# Patient Record
Sex: Male | Born: 1950 | Race: White | Hispanic: No | Marital: Married | Smoking: Former smoker
Health system: Southern US, Community
[De-identification: ages and names within clinical notes are randomized; demographics above are authoritative.]

## PROBLEM LIST (undated history)

## (undated) DIAGNOSIS — J069 Acute upper respiratory infection, unspecified: Secondary | ICD-10-CM

## (undated) DIAGNOSIS — J45909 Unspecified asthma, uncomplicated: Secondary | ICD-10-CM

## (undated) DIAGNOSIS — I10 Essential (primary) hypertension: Secondary | ICD-10-CM

## (undated) DIAGNOSIS — K219 Gastro-esophageal reflux disease without esophagitis: Secondary | ICD-10-CM

## (undated) HISTORY — DX: Unspecified asthma, uncomplicated: J45.909

## (undated) HISTORY — DX: Acute upper respiratory infection, unspecified: J06.9

---

## 2013-12-28 ENCOUNTER — Ambulatory Visit
Admission: RE | Admit: 2013-12-28 | Discharge: 2013-12-28 | Disposition: A | Discharge status: Home / Self Care | Payer: BC Managed Care – PPO | Source: Ambulatory Visit | Attending: Family Medicine | Admitting: Family Medicine

## 2013-12-28 ENCOUNTER — Other Ambulatory Visit: Payer: Self-pay | Admitting: Family Medicine

## 2013-12-28 DIAGNOSIS — R05 Cough: Secondary | ICD-10-CM

## 2013-12-28 DIAGNOSIS — R053 Chronic cough: Secondary | ICD-10-CM

## 2013-12-28 DIAGNOSIS — R0789 Other chest pain: Secondary | ICD-10-CM

## 2015-03-07 IMAGING — CR DG CHEST 2V
2 series · 2 of 2 positions shown · non-contrast
Comparison: None.

CLINICAL DATA: Cough

EXAM:
CHEST  2 VIEW

[view not recorded (1 of 2)]
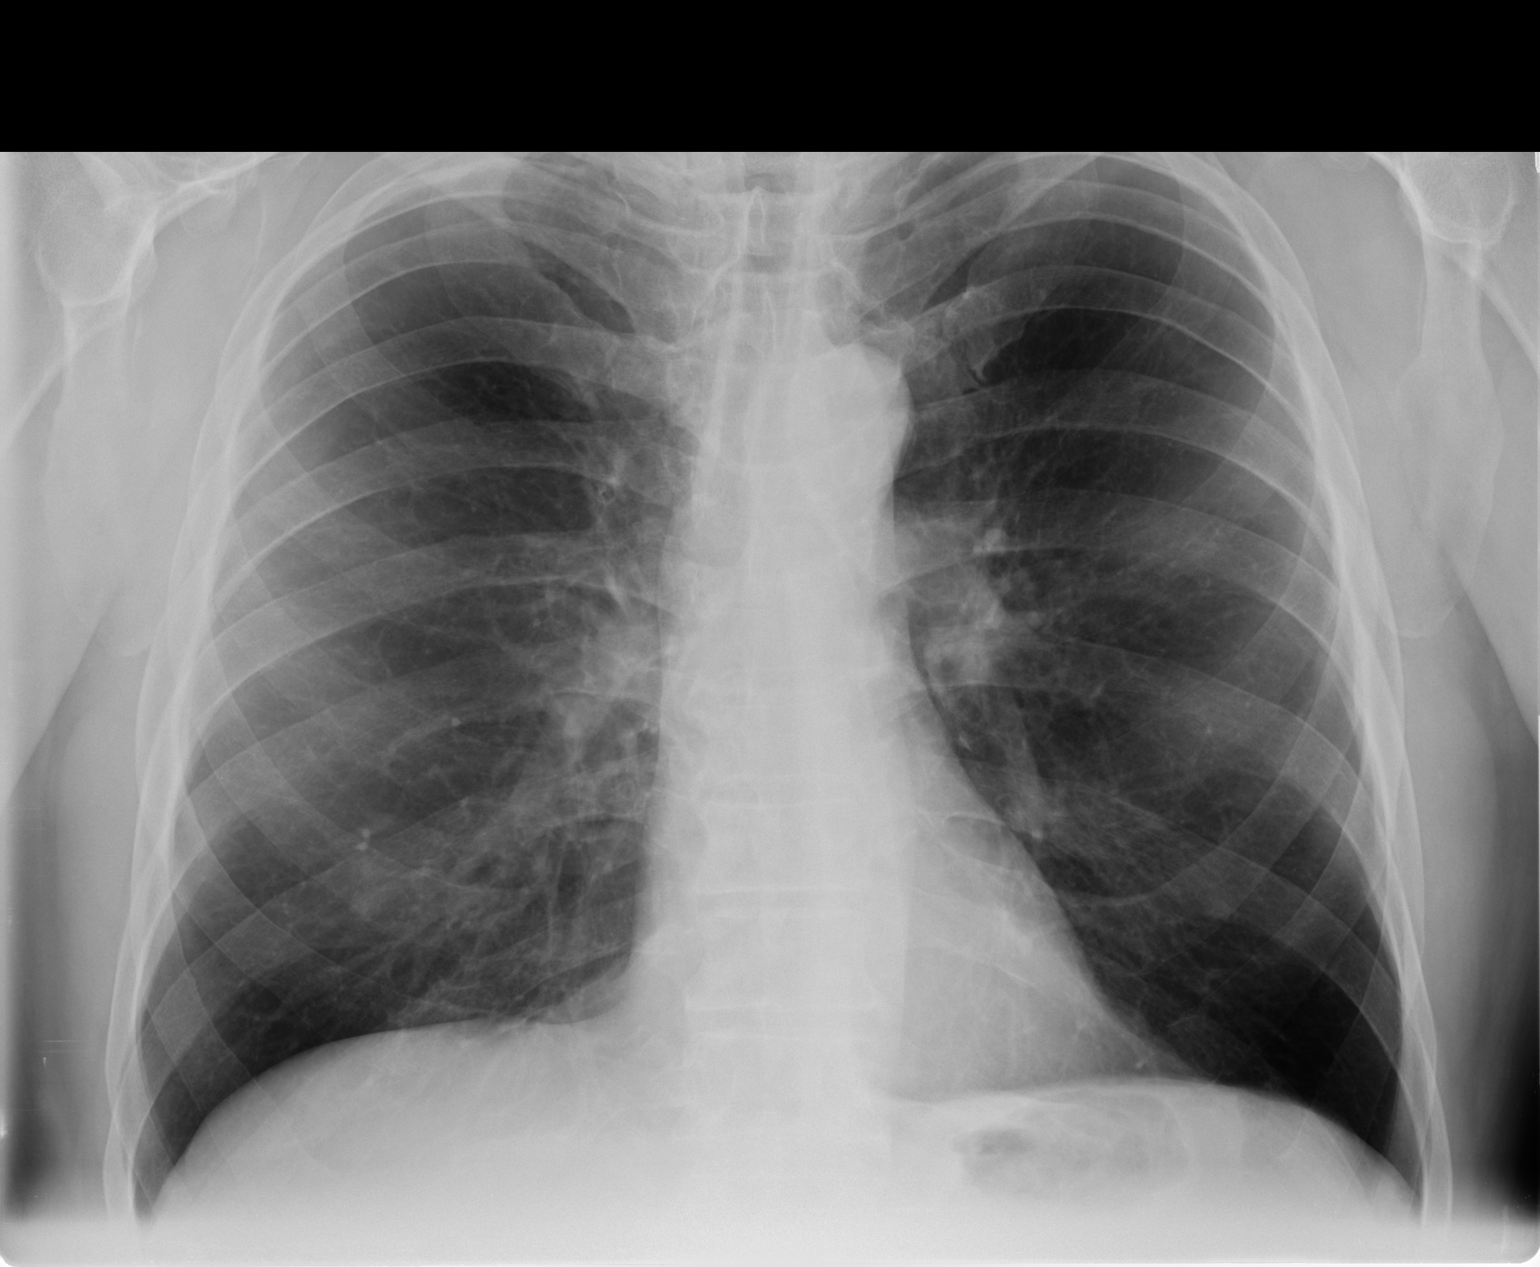

[view not recorded (2 of 2)]
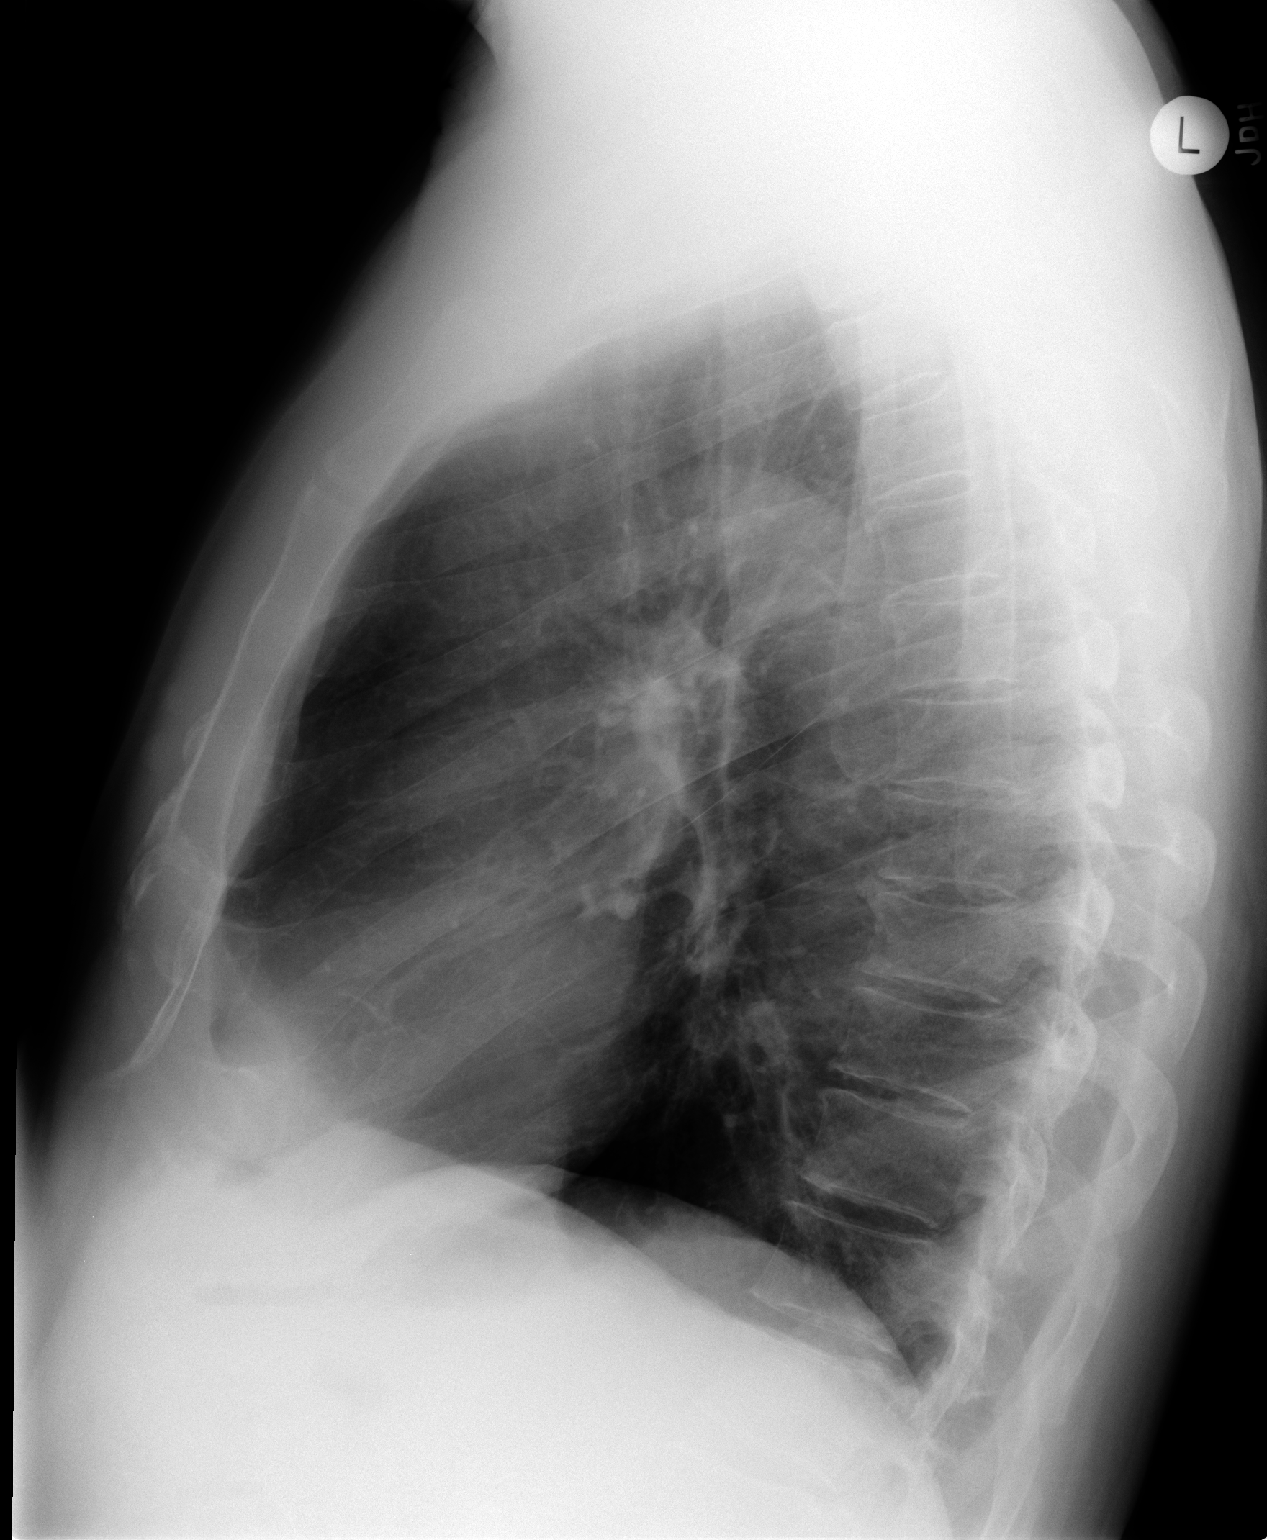

[2 of 2 positions shown; findings below may reference images not displayed]

FINDINGS: Normal cardiac and mediastinal contours. No consolidative pulmonary
opacities. Suggestion of a 1.5 cm nodular density projecting within
the infrahilar location on the lateral view. No pleural effusion or
pneumothorax. Mid thoracic spine degenerative change.
IMPRESSION: Suggestion of a 1.5 cm nodular density projecting within the
infrahilar location on the lateral view. While this may potentially
be secondary to overlapping structures, a true pulmonary nodule in
this location is not excluded. Recommend correlation with priors if
available. If no priors are available for comparison, this can be
correlated with chest CT.

No acute cardiopulmonary process

These results will be called to the ordering clinician or
representative by the Radiologist Assistant, and communication
documented in the PACS or zVision Dashboard.

## 2023-03-09 NOTE — Progress Notes (Unsigned)
New Patient Note  RE: Roberto [name] MRN: [id number] DOB: [birth year] Date of Office Visit: 03/10/2023  Consult requested by: Alver Fisher, MD Primary care provider: Patient, No Pcp Per  Chief Complaint: Establish Care, Allergy Testing (Seasonal or food allergies), and asthma  History of Present Illness: I had the pleasure of seeing Wain Murano for initial evaluation at the [facility] on 03/10/2023. He is a 72 y.o. male, who is referred here by Dr. Carlisle Cater (pulmonology) for the evaluation of allergic rhinitis and asthma.   Discussed the use of AI scribe software for clinical note transcription with the patient, who gave verbal consent to proceed.  The patient, a veteran with a history of asthma, presented with a long-standing history of breathing difficulties. He reported waking up in the middle of the night, requiring the use of an emergency inhaler due to shortness of breath. This has been a recurring issue for many years, with the patient describing symptoms of wheezing and congestion, irrespective of the time of year. The frequency of inhaler use varied, with the patient reporting going through an inhaler in two to three weeks during episodes of severe breathing difficulties.  The patient also reported a history of allergies, particularly during the spring season, which he defined as starting in April and ending in June. During this period, he experienced significant allergy symptoms, requiring daily use of Flonase. Outside of this season, the use of Flonase was infrequent.   The patient had a recent consultation with a pulmonology specialist due to two urgent care visits for breathing difficulties. The specialist prescribed Asmanex and Montelukast, which the patient reported as significantly improving his breathing issues.  In addition to his respiratory issues, the patient reported a history of cholesterol and lactose intolerance. He also reported a  reaction to a testosterone booster supplement, which caused wheeziness. The patient denied any other food allergies or restrictions.  The patient had a CT scan scheduled due to an unidentified finding in the upper right lung from a previous CT scan. He also reported having had three courses of steroids in the past year due to his breathing problems. The patient denied any history of pneumonia or hospitalization due to his breathing issues.      Previous work up includes: 2024 bloodwork was positive to grass, trees, ragweed. No prior AIT. Last eye exam: within the past year. History of reflux: denies.  Smoking exposure: quit in 1997. Up to date with flu vaccine: yes. Up to date with pneumonia vaccine: yes. Up to date with COVID-19 vaccine: yes. Prior Covid-19 infection: yes.  Testosterone supplement    Assessment and Plan: Riky is a 72 y.o. male with: Other allergic rhinitis Seasonal allergic rhinitis due to pollen Allergic conjunctivitis of both eyes Symptoms of nasal congestion and itchy eyes during spring months (April-June). Managed with Flonase and occasional eye drops. No history of allergy shots or ENT consultation. 2024 bloodwork was positive to grass, trees, ragweed. Start environmental control measures as below. Continue Singulair (montelukast) 10mg  daily at night. Use Flonase (fluticasone) nasal spray 1-2 sprays per nostril once a day as needed for nasal congestion.  Nasal saline spray (i.e., Simply Saline) or nasal saline lavage (i.e., NeilMed) is recommended as needed and prior to medicated nasal sprays. Consider allergy injections for long term control if above medications do not help the symptoms.  Not well controlled moderate persistent asthma Long-standing history of wheezing and trouble breathing, with increased albuterol inhaler use in  the past year. Recent episodes of nocturnal dyspnea requiring emergency inhaler use. Currently managed with Asmanex 1 puff  twice daily and Montelukast daily, with Albuterol as needed. Recent pulmonary consultation with breathing tests and upcoming CT scan. Had elevated FenO. Requesting notes from the [place]. Today's spirometry showed no overt abnormalities noted given today's efforts with  no improvement in FEV1 post bronchodilator treatment. Clinically feeling unchanged.  Keep follow up with pulmonology and get CT chest as scheduled.  Daily controller medication(s): Asmanex 1 puff twice a day with spacer and rinse mouth afterwards. Once asthma is better controlled - recommend to use Combivent Respimat 2 puffs only as needed.  Breathing may be better controlled with ICS/LABA inhalers such as below - depending on what the [place] covers.  May use albuterol rescue inhaler 2 puffs every 4 to 6 hours as needed for shortness of breath, chest tightness, coughing, and wheezing. May use albuterol rescue inhaler 2 puffs 5 to 15 minutes prior to strenuous physical activities. Monitor frequency of use - if you need to use it more than twice per week on a consistent basis let us know.   Adverse effect of drug, subsequent encounter Reported adverse reactions to testosterone supplement (wheezing). Tolerates other capsule medications with no issues. Discussed that there is no skin testing for the specific ingredients in the supplement. Continue to avoid for now.   Return in about 6 months (around 09/08/2023).  No orders of the defined types were placed in this encounter.  Lab Orders  No laboratory test(s) ordered today    Other allergy screening: Food allergy: no Medication allergy:  Statins - leg pain Hymenoptera allergy: no Urticaria: no Eczema:no History of recurrent infections suggestive of immunodeficency: no  Diagnostics: Spirometry:  Tracings reviewed. His effort: It was hard to get consistent efforts and there is a question as to whether this reflects a maximal maneuver. FVC: 3.39L FEV1: 2.07L, 63%  predicted FEV1/FVC ratio: 61% Interpretation: No overt abnormalities noted given today's efforts with  no improvement in FEV1 post bronchodilator treatment. Clinically feeling unchanged.    Please see scanned spirometry results for details.  Past Medical History: There are no problems to display for this patient.  Past Medical History:  Diagnosis Date   Asthma    Recurrent upper respiratory infection (URI)    Past Surgical History: History reviewed. No pertinent surgical history. Medication List:  Current Outpatient Medications  Medication Sig Dispense Refill   ALBUTEROL SULFATE HFA IN      Mometasone Furoate 200 MCG/ACT AERO Take by mouth.     montelukast (SINGULAIR) 10 MG tablet Take by mouth.     Olodaterol HCl 2.5 MCG/ACT AERS Take by mouth.     rosuvastatin (CRESTOR) 40 MG tablet Take 40 mg by mouth daily.     sildenafil (VIAGRA) 100 MG tablet Take by mouth.     mometasone (ASMANEX) 220 MCG/ACT inhaler 1 puff in the evening Inhalation Once a day     No current facility-administered medications for this visit.   Allergies: No Known Allergies Social History: Social History   Socioeconomic History   Marital status: Married    Spouse name: Not on file   Number of children: Not on file   Years of education: Not on file   Highest education level: Not on file  Occupational History   Not on file  Tobacco Use   Smoking status: Former    Current packs/day: 0.00    Types: Cigarettes    Quit  date: 56    Years since quitting: 27.8    Passive exposure: Past   Smokeless tobacco: Never  Substance and Sexual Activity   Alcohol use: Not on file   Drug use: Not on file   Sexual activity: Not on file  Other Topics Concern   Not on file  Social History Narrative   Not on file   Social Determinants of Health   Financial Resource Strain: Not on file  Food Insecurity: Not on file  Transportation Needs: Not on file  Physical Activity: Not on file  Stress: Not on file   Social Connections: Not on file   Lives in a house. Smoking: quit in 1997 Occupation: retired Engineer, materials History: Water Damage/mildew in the house: no Engineer, civil (consulting) in the family room: no Carpet in the bedroom: yes Heating: heat pump Cooling: central Pet: no  Family History: History reviewed. No pertinent family history. Problem                               Relation Asthma                                   no Eczema                                no Food allergy                          no Allergic rhino conjunctivitis     no  Review of Systems  Constitutional:  Negative for appetite change, chills, fever and unexpected weight change.  HENT:  Negative for congestion and rhinorrhea.   Eyes:  Positive for itching.  Respiratory:  Negative for cough, chest tightness, shortness of breath and wheezing.   Cardiovascular:  Negative for chest pain.  Gastrointestinal:  Negative for abdominal pain.  Genitourinary:  Negative for difficulty urinating.  Skin:  Negative for rash.  Allergic/Immunologic: Positive for environmental allergies.  Neurological:  Negative for headaches.    Objective: BP (!) 150/86   Pulse 84   Temp 98.4 F (36.9 C)   Resp 12   Ht 6' (1.829 m)   Wt 208 lb 4.8 oz (94.5 kg)   SpO2 96%   BMI 28.25 kg/m  Body mass index is 28.25 kg/m. Physical Exam Vitals and nursing note reviewed.  Constitutional:      Appearance: Normal appearance. He is well-developed.  HENT:     Head: Normocephalic and atraumatic.     Right Ear: Tympanic membrane and external ear normal.     Left Ear: Tympanic membrane and external ear normal.     Nose: Nose normal.     Mouth/Throat:     Mouth: Mucous membranes are moist.     Pharynx: Oropharynx is clear.  Eyes:     Conjunctiva/sclera: Conjunctivae normal.  Cardiovascular:     Rate and Rhythm: Normal rate and regular rhythm.     Heart sounds: Normal heart sounds. No murmur heard.    No friction rub. No  gallop.  Pulmonary:     Effort: Pulmonary effort is normal.     Breath sounds: Normal breath sounds. No wheezing, rhonchi or rales.  Musculoskeletal:     Cervical back: Neck supple.  Skin:  General: Skin is warm.     Findings: No rash.  Neurological:     Mental Status: He is alert and oriented to person, place, and time.  Psychiatric:        Behavior: Behavior normal.   The plan was reviewed with the patient/family, and all questions/concerned were addressed.  It was my pleasure to see Roberto [name] today and participate in his care. Please feel free to contact me with any questions or concerns.  Sincerely,  Wyline Mood, DO Allergy & Immunology  [facility] office: [phone] [facility] office: [phone]

## 2023-03-10 ENCOUNTER — Other Ambulatory Visit: Payer: Self-pay

## 2023-03-10 ENCOUNTER — Ambulatory Visit (INDEPENDENT_AMBULATORY_CARE_PROVIDER_SITE_OTHER): Payer: No Typology Code available for payment source | Admitting: Allergy

## 2023-03-10 ENCOUNTER — Encounter: Payer: Self-pay | Admitting: Allergy

## 2023-03-10 VITALS — BP 150/86 | HR 84 | Temp 98.4°F | Resp 12 | Ht 72.0 in | Wt 208.3 lb

## 2023-03-10 DIAGNOSIS — J3089 Other allergic rhinitis: Secondary | ICD-10-CM

## 2023-03-10 DIAGNOSIS — Z888 Allergy status to other drugs, medicaments and biological substances status: Secondary | ICD-10-CM

## 2023-03-10 DIAGNOSIS — J301 Allergic rhinitis due to pollen: Secondary | ICD-10-CM

## 2023-03-10 DIAGNOSIS — T50905D Adverse effect of unspecified drugs, medicaments and biological substances, subsequent encounter: Secondary | ICD-10-CM

## 2023-03-10 DIAGNOSIS — J4540 Moderate persistent asthma, uncomplicated: Secondary | ICD-10-CM | POA: Diagnosis not present

## 2023-03-10 DIAGNOSIS — H1013 Acute atopic conjunctivitis, bilateral: Secondary | ICD-10-CM | POA: Diagnosis not present

## 2023-03-10 NOTE — Patient Instructions (Addendum)
Requesting notes from the [place].  Environmental allergies Start environmental control measures as below. Continue Singulair (montelukast) 10mg  daily at night. Use Flonase (fluticasone) nasal spray 1-2 sprays per nostril once a day as needed for nasal congestion.  Nasal saline spray (i.e., Simply Saline) or nasal saline lavage (i.e., NeilMed) is recommended as needed and prior to medicated nasal sprays.  Breathing Keep follow up with pulmonology. Daily controller medication(s): Asmanex 1 puff twice a day with spacer and rinse mouth afterwards. Once asthma is better controlled - recommend to use Combivent Respimat 2 puffs only as needed.  Breathing may be better controlled with ICS/LABA inhalers such as below - depending on what the [place] covers.  Dulera Advair HFA Advair Diskus Wixela Breo Symbicort  May use albuterol rescue inhaler 2 puffs every 4 to 6 hours as needed for shortness of breath, chest tightness, coughing, and wheezing. May use albuterol rescue inhaler 2 puffs 5 to 15 minutes prior to strenuous physical activities. Monitor frequency of use - if you need to use it more than twice per week on a consistent basis let us know.  Breathing control goals:  Full participation in all desired activities (may need albuterol before activity) Albuterol use two times or less a week on average (not counting use with activity) Cough interfering with sleep two times or less a month Oral steroids no more than once a year No hospitalizations   Return in about 6 months (around 09/08/2023). Or sooner if needed.  Make sure you get the CT chest done. Make sure you follow up with your PCP and pulmonologist.   Reducing Pollen Exposure Pollen seasons: trees (spring), grass (summer) and ragweed/weeds (fall). Keep windows closed in your home and car to lower pollen exposure.  Install air conditioning in the bedroom and throughout the house if possible.  Avoid going out in dry windy days -  especially early morning. Pollen counts are highest between 5 - 10 AM and on dry, hot and windy days.  Save outside activities for late afternoon or after a heavy rain, when pollen levels are lower.  Avoid mowing of grass if you have grass pollen allergy. Be aware that pollen can also be transported indoors on people and pets.  Dry your clothes in an automatic dryer rather than hanging them outside where they might collect pollen.  Rinse hair and eyes before bedtime.

## 2023-09-08 ENCOUNTER — Encounter: Payer: Self-pay | Admitting: Internal Medicine

## 2023-09-08 ENCOUNTER — Other Ambulatory Visit: Payer: Self-pay

## 2023-09-08 ENCOUNTER — Ambulatory Visit: Payer: No Typology Code available for payment source | Admitting: Allergy

## 2023-09-08 ENCOUNTER — Ambulatory Visit (INDEPENDENT_AMBULATORY_CARE_PROVIDER_SITE_OTHER): Admitting: Internal Medicine

## 2023-09-08 VITALS — BP 134/86 | HR 85 | Temp 98.1°F | Resp 18 | Ht 70.87 in | Wt 210.4 lb

## 2023-09-08 DIAGNOSIS — H1013 Acute atopic conjunctivitis, bilateral: Secondary | ICD-10-CM | POA: Insufficient documentation

## 2023-09-08 DIAGNOSIS — J3089 Other allergic rhinitis: Secondary | ICD-10-CM

## 2023-09-08 DIAGNOSIS — J4540 Moderate persistent asthma, uncomplicated: Secondary | ICD-10-CM | POA: Diagnosis not present

## 2023-09-08 DIAGNOSIS — J302 Other seasonal allergic rhinitis: Secondary | ICD-10-CM | POA: Insufficient documentation

## 2023-09-08 HISTORY — DX: Other allergic rhinitis: J30.89

## 2023-09-08 NOTE — Patient Instructions (Signed)
 Environmental allergies 2024 bloodwork was positive to grass, trees, ragweed. Start environmental control measures as below. Continue Singulair (montelukast) 10mg  daily at night. Use Flonase (fluticasone) nasal spray 1-2 sprays per nostril once a day as needed for nasal congestion.  Nasal saline spray (i.e., Simply Saline) or nasal saline lavage (i.e., NeilMed) is recommended as needed and prior to medicated nasal sprays. -Consider allergy injections to reduce lifetime symptoms and need for medications by teaching your immune system to become tolerant of the environmental allergens you are allergic to-return for allergy skin testing. Will wait till after July when your allergies are better controlled per your request.   Breathing Keep follow up with pulmonology. Daily controller medication(s): Asmanex 220mcg 1 puff twice a day with spacer and rinse mouth afterwards. Use Combivent Respimat 2 puffs only as needed.  May use albuterol rescue inhaler 2 puffs every 4 to 6 hours as needed for shortness of breath, chest tightness, coughing, and wheezing. May use albuterol rescue inhaler 2 puffs 5 to 15 minutes prior to strenuous physical activities. Monitor frequency of use - if you need to use it more than twice per week on a consistent basis let us  know.  Breathing control goals:  Full participation in all desired activities (may need albuterol before activity) Albuterol use two times or less a week on average (not counting use with activity) Cough interfering with sleep two times or less a month Oral steroids no more than once a year No hospitalizations   Follow up : after July for allergy testing 1-55 in preparation for allergy injections  It was a pleasure meeting you in clinic today! Thank you for allowing me to participate in your care.  Jonathon Neighbors, MD [facility]   Reducing Pollen Exposure Pollen seasons: trees (spring), grass (summer) and ragweed/weeds  (fall). Keep windows closed in your home and car to lower pollen exposure.  Install air conditioning in the bedroom and throughout the house if possible.  Avoid going out in dry windy days - especially early morning. Pollen counts are highest between 5 - 10 AM and on dry, hot and windy days.  Save outside activities for late afternoon or after a heavy rain, when pollen levels are lower.  Avoid mowing of grass if you have grass pollen allergy. Be aware that pollen can also be transported indoors on people and pets.  Dry your clothes in an automatic dryer rather than hanging them outside where they might collect pollen.  Rinse hair and eyes before bedtime.

## 2023-09-08 NOTE — Progress Notes (Signed)
 FOLLOW UP Date of Service/Encounter:  09/08/23  Subjective:  Roberto [name] (DOB: 1950-11-19) is a 73 y.o. male who [facility] in re-evaluation of the following: allergic rhinitis, asthma History obtained from: chart review and patient.  For Review, LV was on 03/10/23  with Dr. Burdette Carolin seen for intial visit for allergic rhinitis and asthma . See below for summary of history and diagnostics.  ----------------------------------------------------- Pertinent History/Diagnostics:  Other allergic rhinitis Seasonal allergic rhinitis due to pollen Allergic conjunctivitis of both eyes Symptoms of nasal congestion and itchy eyes during spring months (April-June). Managed with Flonase and occasional eye drops. No history of allergy shots or ENT consultation. 2024 bloodwork was positive to grass, trees, ragweed. Current plan: singulair 10 mg nightly, flonase 1-2 sprays daily, saline spray, consider AIT. Not well controlled moderate persistent asthma Long-standing history of wheezing and trouble breathing, with increased albuterol inhaler use in the past year. Recent episodes of nocturnal dyspnea requiring emergency inhaler use. Currently managed with Asmanex 220mcg 1 puff twice daily and Montelukast daily, with Albuterol as needed. Recent pulmonary consultation with breathing tests and upcoming CT scan. Had elevated FenO. Managed by Pulm at [facility] Current meds: Asmanex 220 mcg 2 puff BID, combivent respimat Adverse effect of drug, subsequent encounter Reported adverse reactions to testosterone supplement (wheezing). Tolerates other capsule medications with no issues. --------------------------------------------------- Today presents for follow-up. Discussed the use of AI scribe software for clinical note transcription with the patient, who gave verbal consent to proceed.  History of Present Illness   Roberto [name] is a 73 year old male with allergic  rhinitis and asthma who presents for management of his allergies.  He experiences significant allergy symptoms during the spring, particularly in May and June, due to tree pollen. He has a history of severe reactions to poison ivy during childhood, which required medical attention. He is considering further allergy testing and potential allergy injections.  His asthma is managed with Singulair at night, Asmanex inhaler once in the morning and once at night, and Combivent inhaler twice daily. His breathing is well-controlled with this regimen, and he finds regular exercise beneficial for his overall health. He is followed by pulmonary at the [place].  For allergic rhinitis, he uses Flonase nasal spray each morning. He avoids oral antihistamines as they cause undesirable side effects. Blood work done in 2024 indicated allergies to specific trees and grasses.  He is retired, allowing flexibility in scheduling future appointments. He is not currently taking any antihistamines other than Singulair and Flonase, which he plans to continue. No uncontrolled breathing issues with current medication regimen.       All medications reviewed by clinical staff and updated in chart. No new pertinent medical or surgical history except as noted in HPI.  ROS: All others negative except as noted per HPI.   Objective:  BP 134/86 (BP Location: Right Arm, Patient Position: Sitting, Cuff Size: Normal)   Pulse 85   Temp 98.1 F (36.7 C) (Temporal)   Resp 18   Ht 5' 10.87" (1.8 m)   Wt 210 lb 6.4 oz (95.4 kg)   SpO2 95%   BMI 29.46 kg/m  Body mass index is 29.46 kg/m. Physical Exam: General Appearance:  Alert, cooperative, no distress, appears stated age  Head:  Normocephalic, without obvious abnormality, atraumatic  Eyes:  Conjunctiva clear, EOM's intact  Ears EACs normal bilaterally and normal TMs bilaterally  Nose: Nares normal, normal mucosa and no visible anterior  polyps  Throat: Lips, tongue normal; teeth  and gums normal, normal posterior oropharynx  Neck: Supple, symmetrical  Lungs:   clear to auscultation bilaterally, Respirations unlabored, no coughing  Heart:  regular rate and rhythm and no murmur, Appears well perfused  Extremities: No edema  Skin: Skin color, texture, turgor normal and no rashes or lesions on visualized portions of skin  Neurologic: No gross deficits   Labs:  Lab Orders  No laboratory test(s) ordered today    Assessment/Plan   Environmental allergies-not at goal, interested in AIT. 2024 bloodwork was positive to grass, trees, ragweed. Start environmental control measures as below. Continue Singulair (montelukast) 10mg  daily at night. Use Flonase (fluticasone) nasal spray 1-2 sprays per nostril once a day as needed for nasal congestion.  Nasal saline spray (i.e., Simply Saline) or nasal saline lavage (i.e., NeilMed) is recommended as needed and prior to medicated nasal sprays. -Consider allergy injections to reduce lifetime symptoms and need for medications by teaching your immune system to become tolerant of the environmental allergens you are allergic to-return for allergy skin testing. Will wait till after July when your allergies are better controlled per your request.   Breathing-controlled, managed by pulmonary at [facility] Keep follow up with pulmonology. Daily controller medication(s): Asmanex 220mcg 1 puff twice a day with spacer and rinse mouth afterwards. Use Combivent Respimat 2 puffs only as needed.  May use albuterol rescue inhaler 2 puffs every 4 to 6 hours as needed for shortness of breath, chest tightness, coughing, and wheezing. May use albuterol rescue inhaler 2 puffs 5 to 15 minutes prior to strenuous physical activities. Monitor frequency of use - if you need to use it more than twice per week on a consistent basis let us  know.  Breathing control goals:  Full participation in all desired activities (may need albuterol before activity) Albuterol use  two times or less a week on average (not counting use with activity) Cough interfering with sleep two times or less a month Oral steroids no more than once a year No hospitalizations   Follow up : after July for allergy testing 1-55 in preparation for allergy injections  It was a pleasure meeting you in clinic today! Thank you for allowing me to participate in your care.  Other: none  Jonathon Neighbors, MD  [facility] 

## 2023-12-01 ENCOUNTER — Inpatient Hospital Stay (HOSPITAL_COMMUNITY)
Admission: EM | Admit: 2023-12-01 | Discharge: 2023-12-05 | DRG: 669 | Disposition: A | Discharge status: Home / Self Care | Attending: Internal Medicine | Admitting: Internal Medicine

## 2023-12-01 ENCOUNTER — Other Ambulatory Visit: Payer: Self-pay

## 2023-12-01 ENCOUNTER — Encounter (HOSPITAL_COMMUNITY): Payer: Self-pay | Admitting: *Deleted

## 2023-12-01 DIAGNOSIS — R339 Retention of urine, unspecified: Secondary | ICD-10-CM | POA: Diagnosis present

## 2023-12-01 DIAGNOSIS — I10 Essential (primary) hypertension: Secondary | ICD-10-CM | POA: Diagnosis present

## 2023-12-01 DIAGNOSIS — N3289 Other specified disorders of bladder: Secondary | ICD-10-CM | POA: Diagnosis present

## 2023-12-01 DIAGNOSIS — Z87891 Personal history of nicotine dependence: Secondary | ICD-10-CM

## 2023-12-01 DIAGNOSIS — K5900 Constipation, unspecified: Secondary | ICD-10-CM | POA: Diagnosis present

## 2023-12-01 DIAGNOSIS — C679 Malignant neoplasm of bladder, unspecified: Secondary | ICD-10-CM | POA: Diagnosis not present

## 2023-12-01 DIAGNOSIS — R109 Unspecified abdominal pain: Secondary | ICD-10-CM | POA: Diagnosis present

## 2023-12-01 DIAGNOSIS — D62 Acute posthemorrhagic anemia: Secondary | ICD-10-CM | POA: Diagnosis not present

## 2023-12-01 DIAGNOSIS — J449 Chronic obstructive pulmonary disease, unspecified: Secondary | ICD-10-CM | POA: Diagnosis present

## 2023-12-01 DIAGNOSIS — D494 Neoplasm of unspecified behavior of bladder: Secondary | ICD-10-CM | POA: Diagnosis present

## 2023-12-01 DIAGNOSIS — Z800 Family history of malignant neoplasm of digestive organs: Secondary | ICD-10-CM

## 2023-12-01 DIAGNOSIS — J4489 Other specified chronic obstructive pulmonary disease: Secondary | ICD-10-CM | POA: Diagnosis present

## 2023-12-01 DIAGNOSIS — M546 Pain in thoracic spine: Secondary | ICD-10-CM | POA: Diagnosis present

## 2023-12-01 DIAGNOSIS — E782 Mixed hyperlipidemia: Secondary | ICD-10-CM | POA: Diagnosis present

## 2023-12-01 DIAGNOSIS — R310 Gross hematuria: Principal | ICD-10-CM | POA: Diagnosis present

## 2023-12-01 DIAGNOSIS — Z79899 Other long term (current) drug therapy: Secondary | ICD-10-CM

## 2023-12-01 DIAGNOSIS — Z7951 Long term (current) use of inhaled steroids: Secondary | ICD-10-CM

## 2023-12-01 LAB — COMPREHENSIVE METABOLIC PANEL WITH GFR
ALT: 26 U/L (ref 0–44)
AST: 23 U/L (ref 15–41)
Albumin: 4.5 g/dL (ref 3.5–5.0)
Alkaline Phosphatase: 75 U/L (ref 38–126)
Anion gap: 12 (ref 5–15)
BUN: 21 mg/dL (ref 8–23)
CO2: 26 mmol/L (ref 22–32)
Calcium: 10 mg/dL (ref 8.9–10.3)
Chloride: 102 mmol/L (ref 98–111)
Creatinine, Ser: 0.88 mg/dL (ref 0.61–1.24)
GFR, Estimated: >60 mL/min (ref >60)
Glucose, Bld: 110 mg/dL — ABNORMAL HIGH (ref 70–99)
Potassium: 4.3 mmol/L (ref 3.5–5.1)
Sodium: 140 mmol/L (ref 135–145)
Total Bilirubin: 0.6 mg/dL (ref 0.0–1.2)
Total Protein: 8.1 g/dL (ref 6.5–8.1)

## 2023-12-01 LAB — CBC WITH DIFFERENTIAL/PLATELET
Abs Immature Granulocytes: 0.12 K/uL — ABNORMAL HIGH (ref 0.00–0.07)
Basophils Absolute: 0.1 K/uL (ref 0.0–0.1)
Basophils Relative: 1 %
Eosinophils Absolute: 0.1 K/uL (ref 0.0–0.5)
Eosinophils Relative: 1 %
HCT: 40.8 % (ref 39.0–52.0)
Hemoglobin: 13.5 g/dL (ref 13.0–17.0)
Immature Granulocytes: 2 %
Lymphocytes Relative: 17 %
Lymphs Abs: 1.3 K/uL (ref 0.7–4.0)
MCH: 31.7 pg (ref 26.0–34.0)
MCHC: 33.1 g/dL (ref 30.0–36.0)
MCV: 95.8 fL (ref 80.0–100.0)
Monocytes Absolute: 0.8 K/uL (ref 0.1–1.0)
Monocytes Relative: 10 %
Neutro Abs: 5.5 K/uL (ref 1.7–7.7)
Neutrophils Relative %: 69 %
Platelets: 244 K/uL (ref 150–400)
RBC: 4.26 MIL/uL (ref 4.22–5.81)
RDW: 12.4 % (ref 11.5–15.5)
WBC: 7.9 K/uL (ref 4.0–10.5)
nRBC: 0 % (ref 0.0–0.2)

## 2023-12-01 LAB — URINALYSIS, W/ REFLEX TO CULTURE (INFECTION SUSPECTED): RBC / HPF: >50 RBC/hpf (ref 0–5)

## 2023-12-01 NOTE — ED Triage Notes (Signed)
 Here by POV from home. Here for urinary retention, onset this afternoon after a week of bloody urine with clots. Went to UC initially, sent here. Pt of the [place]. Since arriving here has voided x2, minimal amount and was bloody and painful. No h/o same or h/o kidney stones. Alert, NAD, calm, interactive. Steady gait.

## 2023-12-01 NOTE — ED Provider Triage Note (Signed)
 Emergency Medicine Provider Triage Evaluation Note  KEELIN SHERIDAN , a 73 y.o. male  was evaluated in triage.  Pt complains of hematuria and difficulty urinating.  Review of Systems  Positive: Bloody urine, small volume urine output, some clots Negative: N, V, fever  Physical Exam  BP (!) 168/81 (BP Location: Left Arm)   Pulse 84   Temp 98.2 F (36.8 C) (Oral)   Resp 16   SpO2 96%  Gen:   Awake, no distress   Resp:  Normal effort  MSK:   Moves extremities without difficulty Other:    Medical Decision Making  Medically screening exam initiated at 6:00 PM.  Appropriate orders placed.  Dvid JAVONTAY VANDAM was informed that the remainder of the evaluation will be completed by another provider, this initial triage assessment does not replace that evaluation, and the importance of remaining in the ED until their evaluation is complete.  Hematuria for about a week, now feeling like he can't empty his bladder completely. No history of stones. No fever.    Odell Balls, PA-C 12/01/23 1801

## 2023-12-02 ENCOUNTER — Encounter (HOSPITAL_COMMUNITY): Payer: Self-pay

## 2023-12-02 ENCOUNTER — Emergency Department (HOSPITAL_COMMUNITY)

## 2023-12-02 ENCOUNTER — Observation Stay (HOSPITAL_COMMUNITY)

## 2023-12-02 DIAGNOSIS — J45909 Unspecified asthma, uncomplicated: Secondary | ICD-10-CM | POA: Insufficient documentation

## 2023-12-02 DIAGNOSIS — I10 Essential (primary) hypertension: Secondary | ICD-10-CM | POA: Diagnosis present

## 2023-12-02 DIAGNOSIS — R310 Gross hematuria: Secondary | ICD-10-CM | POA: Diagnosis not present

## 2023-12-02 DIAGNOSIS — J449 Chronic obstructive pulmonary disease, unspecified: Secondary | ICD-10-CM | POA: Diagnosis present

## 2023-12-02 DIAGNOSIS — J309 Allergic rhinitis, unspecified: Secondary | ICD-10-CM | POA: Insufficient documentation

## 2023-12-02 DIAGNOSIS — E663 Overweight: Secondary | ICD-10-CM | POA: Insufficient documentation

## 2023-12-02 DIAGNOSIS — M545 Low back pain, unspecified: Secondary | ICD-10-CM | POA: Insufficient documentation

## 2023-12-02 DIAGNOSIS — Z87891 Personal history of nicotine dependence: Secondary | ICD-10-CM | POA: Insufficient documentation

## 2023-12-02 DIAGNOSIS — D62 Acute posthemorrhagic anemia: Secondary | ICD-10-CM | POA: Diagnosis present

## 2023-12-02 DIAGNOSIS — E782 Mixed hyperlipidemia: Secondary | ICD-10-CM | POA: Diagnosis present

## 2023-12-02 LAB — BASIC METABOLIC PANEL WITH GFR
Anion gap: 10 (ref 5–15)
BUN: 14 mg/dL (ref 8–23)
CO2: 24 mmol/L (ref 22–32)
Calcium: 8.9 mg/dL (ref 8.9–10.3)
Chloride: 102 mmol/L (ref 98–111)
Creatinine, Ser: 0.86 mg/dL (ref 0.61–1.24)
GFR, Estimated: >60 mL/min (ref >60)
Glucose, Bld: 126 mg/dL — ABNORMAL HIGH (ref 70–99)
Potassium: 3.7 mmol/L (ref 3.5–5.1)
Sodium: 136 mmol/L (ref 135–145)

## 2023-12-02 LAB — HEMOGLOBIN AND HEMATOCRIT, BLOOD
HCT: 37.1 % — ABNORMAL LOW (ref 39.0–52.0)
Hemoglobin: 12.3 g/dL — ABNORMAL LOW (ref 13.0–17.0)

## 2023-12-02 LAB — CBC
HCT: 36.4 % — ABNORMAL LOW (ref 39.0–52.0)
Hemoglobin: 12.6 g/dL — ABNORMAL LOW (ref 13.0–17.0)
MCH: 32.8 pg (ref 26.0–34.0)
MCHC: 34.6 g/dL (ref 30.0–36.0)
MCV: 94.8 fL (ref 80.0–100.0)
Platelets: 218 K/uL (ref 150–400)
RBC: 3.84 MIL/uL — ABNORMAL LOW (ref 4.22–5.81)
RDW: 12.3 % (ref 11.5–15.5)
WBC: 10.9 K/uL — ABNORMAL HIGH (ref 4.0–10.5)
nRBC: 0 % (ref 0.0–0.2)

## 2023-12-02 LAB — TYPE AND SCREEN
ABO/RH(D): A NEG
Antibody Screen: NEGATIVE

## 2023-12-02 LAB — ABO/RH: ABO/RH(D): A NEG

## 2023-12-02 MED ORDER — LIDOCAINE HCL URETHRAL/MUCOSAL 2 % EX GEL
1.0000 | Freq: Once | CUTANEOUS | Status: AC
  Administered 2023-12-02: 1 via URETHRAL
  Filled 2023-12-02: qty 11

## 2023-12-02 MED ORDER — HYDROCODONE-ACETAMINOPHEN 5-325 MG PO TABS
1.0000 | ORAL_TABLET | Freq: Once | ORAL | Status: AC
  Administered 2023-12-02: 1 via ORAL
  Filled 2023-12-02: qty 1

## 2023-12-02 MED ORDER — OXYBUTYNIN CHLORIDE 5 MG PO TABS
5.0000 mg | ORAL_TABLET | Freq: Three times a day (TID) | ORAL | Status: DC
  Administered 2023-12-02 – 2023-12-05 (×10): 5 mg via ORAL
  Filled 2023-12-02 (×10): qty 1

## 2023-12-02 MED ORDER — LIDOCAINE HCL (CARDIAC) PF 100 MG/5ML IV SOSY
PREFILLED_SYRINGE | INTRAVENOUS | Status: AC
  Filled 2023-12-02: qty 5

## 2023-12-02 MED ORDER — HYOSCYAMINE SULFATE 0.125 MG SL SUBL
0.1250 mg | SUBLINGUAL_TABLET | Freq: Once | SUBLINGUAL | Status: AC
  Administered 2023-12-02: 0.125 mg via ORAL
  Filled 2023-12-02: qty 1

## 2023-12-02 MED ORDER — HYDROGEN PEROXIDE 3 % EX SOLN
CUTANEOUS | Status: AC
  Filled 2023-12-02: qty 473

## 2023-12-02 MED ORDER — BUDESONIDE 0.25 MG/2ML IN SUSP: 0.2500 mg | Freq: Two times a day (BID) | RESPIRATORY_TRACT | Status: DC

## 2023-12-02 MED ORDER — ROSUVASTATIN CALCIUM 20 MG PO TABS
20.0000 mg | ORAL_TABLET | Freq: Every day | ORAL | Status: DC
  Administered 2023-12-02 – 2023-12-04 (×3): 20 mg via ORAL
  Filled 2023-12-02 (×3): qty 1

## 2023-12-02 MED ORDER — IOHEXOL 300 MG/ML  SOLN
100.0000 mL | Freq: Once | INTRAMUSCULAR | Status: AC | PRN
  Administered 2023-12-02: 100 mL via INTRAVENOUS

## 2023-12-02 MED ORDER — LIDOCAINE HCL URETHRAL/MUCOSAL 2 % EX GEL
1.0000 | Freq: Once | CUTANEOUS | Status: DC
  Filled 2023-12-02: qty 11

## 2023-12-02 MED ORDER — AMLODIPINE BESYLATE 10 MG PO TABS
5.0000 mg | ORAL_TABLET | Freq: Every day | ORAL | Status: DC
  Administered 2023-12-02 – 2023-12-05 (×4): 5 mg via ORAL
  Filled 2023-12-02 (×4): qty 1

## 2023-12-02 MED ORDER — OXYCODONE HCL 5 MG PO TABS
5.0000 mg | ORAL_TABLET | ORAL | Status: DC | PRN
  Administered 2023-12-02 – 2023-12-04 (×3): 5 mg via ORAL
  Filled 2023-12-02 (×3): qty 1

## 2023-12-02 MED ORDER — SODIUM CHLORIDE 0.9 % IV BOLUS
1000.0000 mL | Freq: Once | INTRAVENOUS | Status: AC
  Administered 2023-12-02: 1000 mL via INTRAVENOUS

## 2023-12-02 MED ORDER — SODIUM CHLORIDE 0.9 % IR SOLN
3000.0000 mL | Status: DC
  Administered 2023-12-02 – 2023-12-04 (×17): 3000 mL

## 2023-12-02 MED ORDER — MOMETASONE FUROATE 200 MCG/ACT IN AERO: 1.0000 | INHALATION_SPRAY | Freq: Two times a day (BID) | RESPIRATORY_TRACT | Status: DC

## 2023-12-02 MED ORDER — MONTELUKAST SODIUM 10 MG PO TABS
10.0000 mg | ORAL_TABLET | Freq: Every day | ORAL | Status: DC
  Administered 2023-12-02 – 2023-12-04 (×3): 10 mg via ORAL
  Filled 2023-12-02 (×3): qty 1

## 2023-12-02 MED ORDER — HYOSCYAMINE SULFATE 0.125 MG PO TABS: 0.1250 mg | ORAL_TABLET | Freq: Once | ORAL | Status: DC

## 2023-12-02 MED ORDER — BUDESONIDE 0.25 MG/2ML IN SUSP
0.2500 mg | Freq: Two times a day (BID) | RESPIRATORY_TRACT | Status: DC
  Administered 2023-12-02 – 2023-12-05 (×5): 0.25 mg via RESPIRATORY_TRACT
  Filled 2023-12-02 (×6): qty 2

## 2023-12-02 MED ORDER — OXYCODONE HCL 5 MG PO TABS
5.0000 mg | ORAL_TABLET | Freq: Four times a day (QID) | ORAL | Status: DC | PRN
  Administered 2023-12-02: 5 mg via ORAL
  Filled 2023-12-02: qty 1

## 2023-12-02 NOTE — ED Notes (Signed)
 Pt family member came to this RN stating pt was c/o bladder fullness. Upon assessment, this RN noted CBI not flowing. This RN and FREDRIK Baller, RN paused CBI, manually irrigated with 300 mL NS until urine returned and pt discomfort subsided. This RN notified FREDRIK High, PA. FREDRIK High, PA instructed this RN to resume CBI at previous slow rate.

## 2023-12-02 NOTE — Plan of Care (Incomplete)
  Problem: Education: Goal: Knowledge of General Education information will improve Description: Including pain rating scale, medication(s)/side effects and non-pharmacologic comfort measures Outcome: Progressing   Problem: Health Behavior/Discharge Planning: Goal: Ability to manage health-related needs will improve Outcome: Progressing   Problem: Clinical Measurements: Goal: Ability to maintain clinical measurements within normal limits will improve Outcome: Progressing   Problem: Activity: Goal: Risk for activity intolerance will decrease Outcome: Progressing   Problem: Elimination: Goal: Will not experience complications related to urinary retention Outcome: Progressing   Problem: Pain Managment: Goal: General experience of comfort will improve and/or be controlled Outcome: Progressing   Problem: Safety: Goal: Ability to remain free from injury will improve Outcome: Progressing   Problem: Clinical Measurements: Goal: Will remain free from infection Outcome: Adequate for Discharge Goal: Diagnostic test results will improve Outcome: Adequate for Discharge Goal: Respiratory complications will improve Outcome: Adequate for Discharge Goal: Cardiovascular complication will be avoided Outcome: Adequate for Discharge   Problem: Nutrition: Goal: Adequate nutrition will be maintained Outcome: Adequate for Discharge   Problem: Coping: Goal: Level of anxiety will decrease Outcome: Adequate for Discharge   Problem: Skin Integrity: Goal: Risk for impaired skin integrity will decrease Outcome: Adequate for Discharge

## 2023-12-02 NOTE — Progress Notes (Signed)
 Came back to evaluated patient. Urine dark red. CT showed some clots. Patient irrigated with saline removed 50cc of clot. CBI restarted. Post procedure abdomen soft.   Plan: - Admit to medicine - start oxybutynin   - continue to CBI - monitor CBC daily.

## 2023-12-02 NOTE — H&P (Signed)
 History and Physical    Patient: Roberto [name] FMW:[id number] DOB: [birth year] DOA: 12/01/2023 DOS: the patient was seen and examined on 12/02/2023 PCP: Delayne Artist PARAS, MD  Patient coming from: Home  Chief Complaint:  Chief Complaint  Patient presents with   Urinary Retention   HPI: Roberto [name] is a 73 y.o. male, [facility] with medical history significant of allergic conjunctivitis, allergic rhinitis, significant asthma, COPD, history of tobacco use in remission, mixed hyperlipidemia, hypertension who presented to the emergency department yesterday with urinary retention after having a week of hematuria with clots.  He was initially seen at urgent care and by with then sent to the emergency department.  He voided twice in the emergency department but his urine was bloody and painful.  He was seen by urology who put him on continuous bladder irrigation, which has improved the color of his urine but still urine red with some clots despite CBI. He denied fever, chills, rhinorrhea, sore throat, wheezing or hemoptysis.  No chest pain, palpitations, diaphoresis, PND, orthopnea or pitting edema of the lower extremities.  No abdominal pain, nausea, emesis, diarrhea, constipation, melena or hematochezia.  No polyuria, polydipsia, polyphagia or blurred vision.   Lab work: Urinalysis with red and turbid with a few bacteria but unable to CBC was normal.  CMP was unremarkable with exception of a glucose of 110 mg/dL.  Imaging: CT renal study showing irregular hyperdensity in the posterior bladder that is incompletely evaluated without IV contrast.  This could represent urothelial malignancy or clot with cystoscopy recommended.  Nonobstructing left nephrolithiasis versus vascular calcification.  Aortic atherosclerosis.  CT hematuria workup showing ill-defined, subtly enhancing mass within the left posterior bladder measuring 3.1 x 2.0 cm, suspicious for primary bladder neoplasm, with likely extension into  the distal left ureter.  Additional lobulated filling defects within the bladder on the delayed phase images, suspicious for additional bladder neoplasms.  No suspicious intraluminal filling defect within the right ureter, noting a short segment of the mid right ureter that is not well-opacified.  No definite filling defects within the left renal pelvis, noting in the left lower pole pelvis/calyx appears mildly prominent.  No enlarged abdominal or pelvic lymph nodes.  Partially imaged subpleural right lower lobe nodule measuring 5 mm, indeterminate.  Nonobstructing 4 mm stone in the left lower pole.  Aortic atherosclerosis.   ED course: Initial vital signs were temperature 98.2 F, pulse 84, respiration 16, BP 168/81 mmHg and O2 sat 96% on room air.  The patient received a tablet of hydrocodone /acetaminophen  5/325 mg p.o. x 1, hyoscyamine  125 mcg p.o. x 1, NS 1000 mL bolus.  And was prude on continuous bladder irrigation.  He was seen by urology who recommended admission to the medical service.  The recommendation to start oxybutynin , continue CBI and monitor H&H.  Review of Systems: As mentioned in the history of present illness. All other systems reviewed and are negative.  Past Medical History:  Diagnosis Date   Asthma    Recurrent upper respiratory infection (URI)    History reviewed. No pertinent surgical history. Social History:  reports that he quit smoking about 28 years ago. His smoking use included cigarettes. He has been exposed to tobacco smoke. He has never used smokeless tobacco. No history on file for alcohol use and drug use.  No Known Allergies  History reviewed. No pertinent family history.  Prior to Admission medications   Medication Sig Start Date End Date Taking? Authorizing Provider  ALBUTEROL  SULFATE  HFA IN     [provider]  mometasone  (ASMANEX ) 220 MCG/ACT inhaler 1 puff in the evening Inhalation Once a day    [provider]  Mometasone  Furoate 200  MCG/ACT AERO Take by mouth. 11/11/22   [provider]  montelukast  (SINGULAIR ) 10 MG tablet Take by mouth. 02/10/23   [provider]  Olodaterol HCl 2.5 MCG/ACT AERS Take by mouth. 02/10/23   [provider]  rosuvastatin  (CRESTOR ) 40 MG tablet Take 40 mg by mouth daily.    [provider]  sildenafil (VIAGRA) 100 MG tablet Take by mouth. 09/10/22   [provider]    Physical Exam: Vitals:   12/02/23 0615 12/02/23 0700 12/02/23 0800 12/02/23 1110  BP: 135/63 (!) 153/91 (!) 166/103 (!) 189/87  Pulse: 68 84 84 98  Resp:   20 16  Temp:   98 F (36.7 C)   TempSrc:   Oral   SpO2: 92% 96% 96% 99%  Weight:      Height:       Physical Exam Vitals and nursing note reviewed.  Constitutional:      General: He is awake. He is not in acute distress.    Appearance: Normal appearance. He is ill-appearing.  HENT:     Head: Normocephalic.     Nose: No rhinorrhea.     Mouth/Throat:     Mouth: Mucous membranes are moist.  Eyes:     General: No scleral icterus.    Pupils: Pupils are equal, round, and reactive to light.  Cardiovascular:     Rate and Rhythm: Normal rate and regular rhythm.  Pulmonary:     Effort: Pulmonary effort is normal.     Breath sounds: Wheezing present. No rales.  Chest:     Chest wall: No tenderness.  Abdominal:     General: Bowel sounds are normal. There is no distension.     Palpations: Abdomen is soft.     Tenderness: There is no abdominal tenderness. There is no right CVA tenderness or left CVA tenderness.  Musculoskeletal:     Cervical back: Neck supple.     Right lower leg: No edema.     Left lower leg: No edema.  Skin:    General: Skin is warm and dry.  Neurological:     General: No focal deficit present.     Mental Status: He is alert and oriented to person, place, and time.  Psychiatric:        Mood and Affect: Mood normal.        Behavior: Behavior normal. Behavior is cooperative.     Data  Reviewed:  Results are pending, will review when available. EKG: Vent. rate 91 BPM PR interval 169 ms QRS duration 103 ms QT/QTcB 390/480 ms P-R-T axes 76 70 68 Sinus rhythm Atrial premature complex Borderline prolonged QT interval  Assessment and Plan: Principal Problem:   ABLA (acute blood loss anemia)  In the setting of:   Gross hematuria In the setting of bladder lesions. Observation/MedSurg. ContinueCBI. Analgesics as needed. Antiemetics as needed. Monitor hematocrit and hemoglobin. Check type and screen. Transfuse as needed. Follow CBC, CMP  in AM. Urology consult appreciated.  Active Problems:   Chronic obstructive pulmonary disease (HCC) Continue mometasone  inhaler twice daily. Albuterol  MDI as needed. Continue montelukast  10 mg p.o. bedtime.    Mixed hyperlipidemia Continue rosuvastatin  40 mg p.o. daily.    Primary hypertension Antihypertensive as needed.    Advance Care Planning:   Code  Status: Full Code   Consults:   Family Communication:   Severity of Illness: The appropriate patient status for this patient is OBSERVATION. Observation status is judged to be reasonable and necessary in order to provide the required intensity of service to ensure the patient's safety. The patient's presenting symptoms, physical exam findings, and initial radiographic and laboratory data in the context of their medical condition is felt to place them at decreased risk for further clinical deterioration. Furthermore, it is anticipated that the patient will be medically stable for discharge from the hospital within 2 midnights of admission.   Author: Alm Dorn Castor, MD 12/02/2023 11:15 AM  For on call review [email or web address].   This document was prepared using Dragon voice recognition software and may contain some unintended transcription errors.

## 2023-12-02 NOTE — ED Notes (Signed)
 ED TO INPATIENT HANDOFF REPORT  Name/Age/Gender Roberto [name] 73 y.o. male  Code Status   Home/SNF/Other Home  Chief Complaint Gross hematuria [R31.0]  Level of Care/Admitting Diagnosis ED Disposition     ED Disposition  Admit   Condition  --   Comment  Hospital Area: [facility] [[id number]]  Level of Care: Med-Surg [16]  May place patient in observation at [facility] or [facility] if equivalent level of care is available:: No  Covid Evaluation: Asymptomatic - no recent exposure (last 10 days) testing not required  Diagnosis: Gross hematuria [599.71.ICD-9-CM]  Admitting Physician: CELINDA ALM LOT [[id number]]  Attending Physician: CELINDA ALM LOT [[id number]]  For patients discharging to extended facilities (i.e. SNF, AL, group homes or LTAC) initiate:: Discharge to SNF/Facility Placement COVID-19 Lab Testing Protocol          Medical History Past Medical History:  Diagnosis Date   Asthma    Mixed hyperlipidemia 12/02/2023   Other allergic rhinitis 09/08/2023   Recurrent upper respiratory infection (URI)     Allergies No Known Allergies  IV Location/Drains/Wounds Patient Lines/Drains/Airways Status     Active Line/Drains/Airways     Name Placement date Placement time Site Days   Peripheral IV 12/02/23 20 G 1 Left;Medial Antecubital 12/02/23  0818  Antecubital  less than 1   Urethral Catheter L. Leisen, RN Straight-tip;Triple-lumen 22 Fr. 12/02/23  9795  Straight-tip;Triple-lumen  less than 1            Labs/Imaging Results for orders placed or performed during the hospital encounter of 12/01/23 (from the past 48 hours)  CBC with Differential     Status: Abnormal   Collection Time: 12/01/23  6:06 PM  Result Value Ref Range   WBC 7.9 4.0 - 10.5 K/uL   RBC 4.26 4.22 - 5.81 MIL/uL   Hemoglobin 13.5 13.0 - 17.0 g/dL   HCT 59.1 60.9 - 47.9 %   MCV 95.8 80.0 - 100.0 fL   MCH 31.7 26.0 - 34.0 pg   MCHC 33.1 30.0 - 36.0 g/dL    RDW 87.5 88.4 - 84.4 %   Platelets 244 150 - 400 K/uL   nRBC 0.0 0.0 - 0.2 %   Neutrophils Relative % 69 %   Neutro Abs 5.5 1.7 - 7.7 K/uL   Lymphocytes Relative 17 %   Lymphs Abs 1.3 0.7 - 4.0 K/uL   Monocytes Relative 10 %   Monocytes Absolute 0.8 0.1 - 1.0 K/uL   Eosinophils Relative 1 %   Eosinophils Absolute 0.1 0.0 - 0.5 K/uL   Basophils Relative 1 %   Basophils Absolute 0.1 0.0 - 0.1 K/uL   Immature Granulocytes 2 %   Abs Immature Granulocytes 0.12 (H) 0.00 - 0.07 K/uL    Comment: Performed at [facility], [address] [place], [place] [address]  Comprehensive metabolic panel     Status: Abnormal   Collection Time: 12/01/23  6:06 PM  Result Value Ref Range   Sodium 140 135 - 145 mmol/L   Potassium 4.3 3.5 - 5.1 mmol/L   Chloride 102 98 - 111 mmol/L   CO2 26 22 - 32 mmol/L   Glucose, Bld 110 (H) 70 - 99 mg/dL    Comment: Glucose reference range applies only to samples taken after fasting for at least 8 hours.   BUN 21 8 - 23 mg/dL   Creatinine, Ser 9.11 0.61 - 1.24 mg/dL   Calcium  10.0 8.9 - 10.3 mg/dL  Total Protein 8.1 6.5 - 8.1 g/dL   Albumin 4.5 3.5 - 5.0 g/dL   AST 23 15 - 41 U/L   ALT 26 0 - 44 U/L   Alkaline Phosphatase 75 38 - 126 U/L   Total Bilirubin 0.6 0.0 - 1.2 mg/dL   GFR, Estimated >39 >39 mL/min    Comment: (NOTE) Calculated using the CKD-EPI Creatinine Equation (2021)    Anion gap 12 5 - 15    Comment: Performed at [facility], [address], [place], [place] [address]  Urinalysis, w/ Reflex to Culture (Infection Suspected) -Urine, Clean Catch     Status: Abnormal   Collection Time: 12/01/23  6:16 PM  Result Value Ref Range   Specimen Source URINE, CLEAN CATCH    Color, Urine RED (A) YELLOW    Comment: BIOCHEMICALS MAY BE AFFECTED BY COLOR CORRECTED ON 07/14 AT 1847: PREVIOUSLY REPORTED AS RED    APPearance TURBID (A) CLEAR   Specific Gravity, Urine  1.005 - 1.030    TEST NOT REPORTED DUE TO COLOR  INTERFERENCE OF URINE PIGMENT   pH  5.0 - 8.0    TEST NOT REPORTED DUE TO COLOR INTERFERENCE OF URINE PIGMENT   Glucose, UA (A) NEGATIVE mg/dL    TEST NOT REPORTED DUE TO COLOR INTERFERENCE OF URINE PIGMENT   Hgb urine dipstick (A) NEGATIVE    TEST NOT REPORTED DUE TO COLOR INTERFERENCE OF URINE PIGMENT   Bilirubin Urine (A) NEGATIVE    TEST NOT REPORTED DUE TO COLOR INTERFERENCE OF URINE PIGMENT   Ketones, ur (A) NEGATIVE mg/dL    TEST NOT REPORTED DUE TO COLOR INTERFERENCE OF URINE PIGMENT   Protein, ur (A) NEGATIVE mg/dL    TEST NOT REPORTED DUE TO COLOR INTERFERENCE OF URINE PIGMENT   Nitrite (A) NEGATIVE    TEST NOT REPORTED DUE TO COLOR INTERFERENCE OF URINE PIGMENT   Leukocytes,Ua (A) NEGATIVE    TEST NOT REPORTED DUE TO COLOR INTERFERENCE OF URINE PIGMENT   Squamous Epithelial / HPF 0-5 0 - 5 /HPF   Non Squamous Epithelial PRESENT (A) NONE SEEN   WBC, UA 6-10 0 - 5 WBC/hpf    Comment: Reflex urine culture not performed if WBC <=10, OR if Squamous epithelial cells >5. If Squamous epithelial cells >5, suggest recollection.   RBC / HPF >50 0 - 5 RBC/hpf   Bacteria, UA FEW (A) NONE SEEN    Comment: Performed at [facility], [address] [place], [place] [address]   CT HEMATURIA WORKUP Result Date: 12/02/2023 CLINICAL DATA:  Urinary retention and hematuria with clots. Earlier same day CT revealed potential bladder tumors. Hematuria protocol for evaluation of upper tract disease. EXAM: CT ABDOMEN AND PELVIS WITHOUT AND WITH CONTRAST TECHNIQUE: Multidetector CT imaging of the abdomen and pelvis was performed following the standard protocol before and following the bolus administration of intravenous contrast. RADIATION DOSE REDUCTION: This exam was performed according to the departmental dose-optimization program which includes automated exposure control, adjustment of the mA and/or kV according to patient size and/or use of iterative reconstruction technique.  CONTRAST:  OMNIPAQUE  IOHEXOL  300 MG/ML  SOLN COMPARISON:  Earlier same day CT abdomen and pelvis FINDINGS: Lower chest: Bibasilar linear atelectasis/scarring. Partially imaged subpleural right lower lobe nodule measures 5 mm (7:1). No pleural effusion or pneumothorax demonstrated. Partially imaged heart size is normal. Hepatobiliary: Subcentimeter segment 2 hypodensity, too small to characterize. No intra or extrahepatic biliary ductal dilation. Normal gallbladder. Pancreas: No  focal lesions or main ductal dilation. Spleen: Normal in size without focal abnormality. Adrenals/Urinary Tract: No adrenal nodules. 4 mm nonobstructing stone in the left lower pole. No hydronephrosis. Right interpolar 1.4 cm simple/minimally complicated cyst. No specific follow-up imaging recommended. Additional subcentimeter hypodensities, too small to characterize but also likely cysts. Prominent left lower pole pelvis/calyx (19:51). Urinary catheter in-situ. Ill-defined, subtly enhancing mass within the left posterior bladder measures 3.1 x 2.0 cm (13:71). On the delayed phase images, there are additional lobulated filling defects. In the midline posterior bladder the filling defect measures 3.4 x 2.2 cm (23:71). No suspicious intraluminal filling defects within the right ureter, noting a short-segment of the mid right ureter is not well opacified. The proximal and distal left ureter and ureterovesical junction are not well opacified with suspicion for bladder mass extension into the distal left ureter (23:71). Stomach/Bowel: Gastric fundal diverticulum. No evidence of bowel wall thickening, distention, or inflammatory changes. Colonic diverticulosis without acute diverticulitis. Normal appendix. Vascular/Lymphatic: Aortic atherosclerosis. No enlarged abdominal or pelvic lymph nodes. Reproductive: Mildly enlarged prostate gland. Other: No free fluid, fluid collection, or free air. Musculoskeletal: Unchanged round sclerotic focus  within the right iliac (3:47). Multilevel degenerative changes of the partially imaged thoracic and lumbar spine. IMPRESSION: 1. Ill-defined, subtly enhancing mass within the left posterior bladder measures 3.1 x 2.0 cm, suspicious for primary bladder neoplasm, with likely extension into the distal left ureter. 2. Additional lobulated filling defects within the bladder on the delayed phase images, suspicious for additional bladder neoplasms. 3. No suspicious intraluminal filling defects within the right ureter, noting a short-segment of the mid right ureter is not well opacified. No definite filling defects within the left renal pelvis, noting the lower pole pelvis/calyx appears mildly prominent. 4. No enlarged abdominal or pelvic lymph nodes. 5. Partially imaged subpleural right lower lobe nodule measures 5 mm, indeterminate. 6. Nonobstructing 4 mm stone in the left lower pole. 7.  Aortic Atherosclerosis (ICD10-I70.0). Electronically Signed   By: Limin  Xu M.D.   On: 12/02/2023 09:27   CT Renal Stone Study Result Date: 12/02/2023 CLINICAL DATA:  Abdominal/flank pain, stone suspected, frank hematuria, urinary retention. EXAM: CT ABDOMEN AND PELVIS WITHOUT CONTRAST TECHNIQUE: Multidetector CT imaging of the abdomen and pelvis was performed following the standard protocol without IV contrast. RADIATION DOSE REDUCTION: This exam was performed according to the departmental dose-optimization program which includes automated exposure control, adjustment of the mA and/or kV according to patient size and/or use of iterative reconstruction technique. COMPARISON:  None Available. FINDINGS: Lower chest: No acute abnormality. Hepatobiliary: Unremarkable liver. Normal gallbladder. No biliary dilation. Pancreas: Unremarkable. Spleen: Unremarkable. Adrenals/Urinary Tract: Normal adrenal glands. Nonobstructing left nephrolithiasis versus vascular calcifications. No or hydronephrosis. Irregular hyperdensity in the posterior  bladder is incompletely evaluated without IV contrast. This could represent urothelial malignancy or clot (circa series 2/image 68). There is a punctate calcification in the hyperdense mass. Stomach/Bowel: Normal caliber large and small bowel. No bowel wall thickening. The appendix is normal. Posterior gastric diverticulum. Vascular/Lymphatic: Aortic atherosclerosis. No enlarged abdominal or pelvic lymph nodes. Reproductive: Unremarkable. Other: No free intraperitoneal fluid or air. Musculoskeletal: No acute fracture. IMPRESSION: 1. Irregular hyperdensity in the posterior bladder is incompletely evaluated without IV contrast. This could represent urothelial malignancy or clot. Recommend cystoscopy. 2. Nonobstructing left nephrolithiasis versus vascular calcifications. 3. Aortic Atherosclerosis (ICD10-I70.0). Electronically Signed   By: Norman Gatlin M.D.   On: 12/02/2023 01:16    Pending Labs Unresulted Labs (From admission, onward)  Start     Ordered   12/02/23 1128  CBC  ONCE - STAT,   STAT        12/02/23 1127   12/02/23 1128  Basic metabolic panel  Once,   R        12/02/23 1127            Vitals/Pain Today's Vitals   12/02/23 0700 12/02/23 0800 12/02/23 1110 12/02/23 1145  BP: (!) 153/91 (!) 166/103 (!) 189/87 (!) 163/79  Pulse: 84 84 98 92  Resp:  20 16   Temp:  98 F (36.7 C)    TempSrc:  Oral    SpO2: 96% 96% 99% 99%  Weight:      Height:      PainSc:        Isolation Precautions No active isolations  Medications Medications  sodium chloride  irrigation 0.9 % 3,000 mL ( Irrigation Restarted 12/02/23 0659)  lidocaine  (XYLOCAINE ) 2 % jelly 1 Application (1 Application Urethral Not Given 12/02/23 1125)  lidocaine  (cardiac) 100 mg/22mL (XYLOCAINE ) 100 MG/5ML injection 2% (  Not Given 12/02/23 1125)  amLODipine  (NORVASC ) tablet 5 mg (has no administration in time range)  Mometasone  Furoate AERO 1 puff (has no administration in time range)  montelukast  (SINGULAIR ) tablet  10 mg (has no administration in time range)  rosuvastatin  (CRESTOR ) tablet 20 mg (has no administration in time range)  oxybutynin  (DITROPAN ) tablet 5 mg (has no administration in time range)  oxyCODONE  (Oxy IR/ROXICODONE ) immediate release tablet 5 mg (has no administration in time range)  lidocaine  (XYLOCAINE ) 2 % jelly 1 Application (1 Application Urethral Given 12/02/23 0148)  HYDROcodone -acetaminophen  (NORCO/VICODIN) 5-325 MG per tablet 1 tablet (1 tablet Oral Given 12/02/23 0239)  hydrogen peroxide  3 % external solution (  Given 12/02/23 0659)  hyoscyamine  (LEVSIN  SL) SL tablet 0.125 mg (0.125 mg Oral Given 12/02/23 0738)  iohexol  (OMNIPAQUE ) 300 MG/ML solution 100 mL (100 mLs Intravenous Contrast Given 12/02/23 0819)  sodium chloride  0.9 % bolus 1,000 mL (0 mLs Intravenous Stopped 12/02/23 1210)    Mobility walks

## 2023-12-02 NOTE — ED Notes (Addendum)
 [name]

## 2023-12-02 NOTE — ED Notes (Signed)
 Continued to irrigated bladder added another bag of normal saline and another 3000.

## 2023-12-02 NOTE — Consult Note (Signed)
 I have been asked to see the patient by Dr. Ubaldo High, for evaluation and management of gross hematuria.[name]  History of present illness: 73 year old Roberto [name] who presented the ER after having a week of gross hematuria after starting Bactrim for suspected UTI.  Patient was unable to urinate earlier today presented to OSH ER then transferred to [facility].  When presenting to the [facility], ER urinated a large clot 22 French catheter was placed and CBI was started.  Nursing had trouble with irrigations and catheter clotting.  CT demonstrates 2 potential tumors at the base of the bladder small bladder size, no significant clot burden.  Labs are within normal limits, creatinine 0.88 WBC 7.9 hematocrit 40.8.  Patient does have about a 30-year smoking history he also states he had painless gross hematuria twice in the past 5 years.  He has never had this worked up.  Past medical history includes likely COPD currently on inhalers.  No cardiac history no blood thinners. Remote family history of colon cancer.   Review of systems: A 12 point comprehensive review of systems was obtained and is negative unless otherwise stated in the history of present illness.  Patient Active Problem List   Diagnosis Date Noted   Other allergic rhinitis 09/08/2023   Allergic conjunctivitis of both eyes 09/08/2023   Moderate persistent asthma without complication 09/08/2023    No current facility-administered medications on file prior to encounter.   Current Outpatient Medications on File Prior to Encounter  Medication Sig Dispense Refill   ALBUTEROL  SULFATE HFA IN      mometasone  (ASMANEX ) 220 MCG/ACT inhaler 1 puff in the evening Inhalation Once a day     Mometasone  Furoate 200 MCG/ACT AERO Take by mouth.     montelukast  (SINGULAIR ) 10 MG tablet Take by mouth.     Olodaterol HCl 2.5 MCG/ACT AERS Take by mouth.     rosuvastatin  (CRESTOR ) 40 MG tablet Take 40 mg by mouth daily.     sildenafil (VIAGRA) 100 MG  tablet Take by mouth.      Past Medical History:  Diagnosis Date   Asthma    Recurrent upper respiratory infection (URI)     History reviewed. No pertinent surgical history.  Social History   Tobacco Use   Smoking status: Former    Current packs/day: 0.00    Types: Cigarettes    Quit date: 1997    Years since quitting: 28.5    Passive exposure: Past   Smokeless tobacco: Never    History reviewed. No pertinent family history.  PE: Vitals:   12/02/23 0403 12/02/23 0530 12/02/23 0615 12/02/23 0700  BP: (!) 178/90 (!) 170/93 135/63 (!) 153/91  Pulse: 77 Roberto 68 84  Resp: 20     Temp: 97.6 F (36.4 C)     TempSrc: Oral     SpO2: 100% 96% 92% 96%  Weight:      Height:       Patient appears to be in no acute distress  patient is alert and oriented x3 Respiratory: Normal work of breathing on room air Cardiovascular: Regular rate and rhythm per monitor Abdomen: Soft nontender nondistended. GU: 22 French three-way catheter CBI clamped no urine drainage.  Circumcised penis, moderate suprapubic tenderness.  Recent Labs    12/01/23 1806  WBC 7.9  HGB 13.5  HCT 40.8   Recent Labs    12/01/23 1806  NA 140  K 4.3  CL 102  CO2 26  GLUCOSE 110*  BUN 21  CREATININE 0.88  CALCIUM  10.0   No results for input(s): LABPT, INR in the last 72 hours. No results for input(s): LABURIN in the last 72 hours. No results found for this or any previous visit.  Imaging: CT shows no hydronephrosis bilaterally, 2 potential masses versus clots at the floor the bladder.  No ureteral stones, moderate size prostate.  No evidence of bladder perforation, small bladder size.  Procedure: Attempted to irrigate catheter catheter unable to aspirate.  But no clots returning.  Attempted irrigation with solution of 1 part hydrogen peroxide  5 parts normal saline.  After several attempts no success catheter exchanged for three-way 22 French hematuria catheter.  A Sims catheter placed urine was  draining light pink with no clots.  Inspection the catheter that was removed demonstrated a faulty catheter that would not aspirate correctly.  Imp: 73 year old Roberto [name] with gross hematuria suspect from bladder mass now draining clear urine on light CBI.  No urologic intervention warranted currently.   Recommendations: - If urine remains cleared will clamp CBI later today. -Set up for ureteroscopy.  Seen by me and I saw him fairly yet so that has been there for a long long time can you see if the remainder of the review of CCT 60 need`- After clamp trial patient may be void trial. - CT urogram ordered for evaluation of upper tracts - Urology will schedule for TURBT as outpatient  Discussed risk benefits alternatives to procedure including bleeding infection damage from structures including injury to the bladder urethra and ureteral orifices possible need of long-term catheter.  Most discussed instillation of gemcitabine chemotherapy postoperatively for prevention of recurrence of bladder cancer.   Thank you for involving me in this patient's care, I will continue to follow along.Please page with any further questions or concerns. Steffan JAYSON Pea

## 2023-12-02 NOTE — ED Notes (Addendum)
 Urology at bedside. Dr. Shane manually irrigated and replaced foley. Bladder irrigation resumed.

## 2023-12-02 NOTE — ED Notes (Signed)
Report called to Connie, RN

## 2023-12-02 NOTE — ED Provider Notes (Signed)
 [place] EMERGENCY DEPARTMENT AT [facility] Provider Note   CSN: [id number] Arrival date & time: 12/01/23  1630     Patient presents with: Urinary Retention   Roberto [name] is a 73 y.o. male.  Patient with past medical history significant for asthma presents to the emergency department complaining of urinary retention onset this afternoon.  He states that for the past year he has had increased urinary frequency, worse in the daytime and that for the past week he has had bloody urine with occasional clots.  He went to an urgent care initially today and then was sent to the emergency department due to gross hematuria.  Initially he was unable to void at all at the Emergency Department and eventually passed a clot and was able to void.  He then had further episodes of being unable to void.  He currently complains of some mild bilateral back pain.  He denies nausea, vomiting, fevers.  He denies dysuria prior to today.   HPI     Prior to Admission medications   Medication Sig Start Date End Date Taking? Authorizing Provider  ALBUTEROL  SULFATE HFA IN     [provider]  mometasone  (ASMANEX ) 220 MCG/ACT inhaler 1 puff in the evening Inhalation Once a day    [provider]  Mometasone  Furoate 200 MCG/ACT AERO Take by mouth. 11/11/22   [provider]  montelukast  (SINGULAIR ) 10 MG tablet Take by mouth. 02/10/23   [provider]  Olodaterol HCl 2.5 MCG/ACT AERS Take by mouth. 02/10/23   [provider]  rosuvastatin  (CRESTOR ) 40 MG tablet Take 40 mg by mouth daily.    [provider]  sildenafil (VIAGRA) 100 MG tablet Take by mouth. 09/10/22   [provider]    Allergies: Patient has no known allergies.    Review of Systems  Updated Vital Signs BP (!) 178/90 (BP Location: Left Arm)   Pulse 77   Temp 97.6 F (36.4 C) (Oral)   Resp 20   Ht 6' (1.829 m)   Wt 96.2 kg   SpO2 100%   BMI 28.75 kg/m    Physical Exam Vitals and nursing note reviewed.  Constitutional:      General: He is not in acute distress.    Appearance: He is well-developed.  HENT:     Head: Normocephalic and atraumatic.  Eyes:     Conjunctiva/sclera: Conjunctivae normal.  Cardiovascular:     Rate and Rhythm: Normal rate and regular rhythm.     Heart sounds: No murmur heard. Pulmonary:     Effort: Pulmonary effort is normal. No respiratory distress.     Breath sounds: Normal breath sounds.  Abdominal:     Palpations: Abdomen is soft.     Tenderness: There is no abdominal tenderness.  Musculoskeletal:        General: No swelling.     Cervical back: Neck supple.  Skin:    General: Skin is warm and dry.     Capillary Refill: Capillary refill takes less than 2 seconds.  Neurological:     Mental Status: He is alert.  Psychiatric:        Mood and Affect: Mood normal.     (all labs ordered are listed, but only abnormal results are displayed) Labs Reviewed  CBC WITH DIFFERENTIAL/PLATELET - Abnormal; Notable for the following components:      Result Value   Abs Immature Granulocytes 0.12 (*)    All other components within normal  limits  COMPREHENSIVE METABOLIC PANEL WITH GFR - Abnormal; Notable for the following components:   Glucose, Bld 110 (*)    All other components within normal limits  URINALYSIS, W/ REFLEX TO CULTURE (INFECTION SUSPECTED) - Abnormal; Notable for the following components:   Color, Urine RED (*)    APPearance TURBID (*)    Glucose, UA   (*)    Value: TEST NOT REPORTED DUE TO COLOR INTERFERENCE OF URINE PIGMENT   Hgb urine dipstick   (*)    Value: TEST NOT REPORTED DUE TO COLOR INTERFERENCE OF URINE PIGMENT   Bilirubin Urine   (*)    Value: TEST NOT REPORTED DUE TO COLOR INTERFERENCE OF URINE PIGMENT   Ketones, ur   (*)    Value: TEST NOT REPORTED DUE TO COLOR INTERFERENCE OF URINE PIGMENT   Protein, ur   (*)    Value: TEST NOT REPORTED DUE TO COLOR INTERFERENCE OF URINE  PIGMENT   Nitrite   (*)    Value: TEST NOT REPORTED DUE TO COLOR INTERFERENCE OF URINE PIGMENT   Leukocytes,Ua   (*)    Value: TEST NOT REPORTED DUE TO COLOR INTERFERENCE OF URINE PIGMENT   Non Squamous Epithelial PRESENT (*)    Bacteria, UA FEW (*)    All other components within normal limits    EKG: None  Radiology: CT Renal Stone Study Result Date: 12/02/2023 CLINICAL DATA:  Abdominal/flank pain, stone suspected, frank hematuria, urinary retention. EXAM: CT ABDOMEN AND PELVIS WITHOUT CONTRAST TECHNIQUE: Multidetector CT imaging of the abdomen and pelvis was performed following the standard protocol without IV contrast. RADIATION DOSE REDUCTION: This exam was performed according to the departmental dose-optimization program which includes automated exposure control, adjustment of the mA and/or kV according to patient size and/or use of iterative reconstruction technique. COMPARISON:  None Available. FINDINGS: Lower chest: No acute abnormality. Hepatobiliary: Unremarkable liver. Normal gallbladder. No biliary dilation. Pancreas: Unremarkable. Spleen: Unremarkable. Adrenals/Urinary Tract: Normal adrenal glands. Nonobstructing left nephrolithiasis versus vascular calcifications. No or hydronephrosis. Irregular hyperdensity in the posterior bladder is incompletely evaluated without IV contrast. This could represent urothelial malignancy or clot (circa series 2/image 68). There is a punctate calcification in the hyperdense mass. Stomach/Bowel: Normal caliber large and small bowel. No bowel wall thickening. The appendix is normal. Posterior gastric diverticulum. Vascular/Lymphatic: Aortic atherosclerosis. No enlarged abdominal or pelvic lymph nodes. Reproductive: Unremarkable. Other: No free intraperitoneal fluid or air. Musculoskeletal: No acute fracture. IMPRESSION: 1. Irregular hyperdensity in the posterior bladder is incompletely evaluated without IV contrast. This could represent urothelial  malignancy or clot. Recommend cystoscopy. 2. Nonobstructing left nephrolithiasis versus vascular calcifications. 3. Aortic Atherosclerosis (ICD10-I70.0). Electronically Signed   By: Norman Gatlin M.D.   On: 12/02/2023 01:16     Procedures   Medications Ordered in the ED  sodium chloride  irrigation 0.9 % 3,000 mL (0 mLs Irrigation Stopped 12/02/23 0559)  hydrogen peroxide  3 % external solution (has no administration in time range)  lidocaine  (XYLOCAINE ) 2 % jelly 1 Application (1 Application Urethral Given 12/02/23 0148)  HYDROcodone -acetaminophen  (NORCO/VICODIN) 5-325 MG per tablet 1 tablet (1 tablet Oral Given 12/02/23 0239)                                    Medical Decision Making Amount and/or Complexity of Data Reviewed Radiology: ordered.  Risk Prescription drug management.   This patient presents to the ED for concern of urinary  retention, this involves an extensive number of treatment options, and is a complaint that carries with it a high risk of complications and morbidity.  The differential diagnosis includes bladder outlet obstruction, decreased urine production, infection, malignancy, others   Co morbidities / Chronic conditions that complicate the patient evaluation  Asthma   Additional history obtained:  Additional history obtained from EMR  Lab Tests:  I Ordered, and personally interpreted labs.  The pertinent results include: UA with red and turbid urine.  Grossly unremarkable CBC, CMP   Imaging Studies ordered:  I ordered imaging studies including CT renal stone study I independently visualized and interpreted imaging which showed  1. Irregular hyperdensity in the posterior bladder is incompletely  evaluated without IV contrast. This could represent urothelial  malignancy or clot. Recommend cystoscopy.  2. Nonobstructing left nephrolithiasis versus vascular  calcifications.  3. Aortic Atherosclerosis   I agree with the radiologist  interpretation   Problem List / ED Course / Critical interventions / Medication management        The patient was treated with over 3 liters of NS for CBI with continued clots blocking the catheter I ordered medication including Norco Reevaluation of the patient after these medicines showed that the patient improved I have reviewed the patients home medicines and have made adjustments as needed   Consultations Obtained:  I requested consultation with the urologist, Dr.Showalter, and discussed lab and imaging findings as well as pertinent plan - they recommend: plan to see patient and make formal recommendations   Social Determinants of Health:  Patient is a former smoker   Test / Admission - Considered:  Patient with gross hematuria and blood clots causing urinary retention.  Three-way catheter was placed and continuous bladder irrigation was completed.  The patient initially had improvement with some clearing of return but eventually began to pass more clots causing blockages.  Urology has been consulted to make further recommendations.  Patient care signed out to Dorn Shropshire, PA-C at shift handoff with disposition pending recommendations      Final diagnoses:  Gross hematuria  Urinary retention    ED Discharge Orders     None          Logan Ubaldo KATHEE DEVONNA 12/02/23 [id number]    Trine Raynell Moder, MD 12/02/23 1843

## 2023-12-02 NOTE — ED Notes (Signed)
 Upon admission patient has had 5 bags of 3000 and .

## 2023-12-02 NOTE — ED Provider Notes (Signed)
  Physical Exam  BP (!) 153/91   Pulse 84   Temp 97.6 F (36.4 C) (Oral)   Resp 20   Ht 6' (1.829 m)   Wt 96.2 kg   SpO2 96%   BMI 28.75 kg/m   Physical Exam  Procedures  Procedures  ED Course / MDM    Medical Decision Making Amount and/or Complexity of Data Reviewed Radiology: ordered.  Risk Prescription drug management. Decision regarding hospitalization.   Medical Decision Making:   Care handed off from [facility], PA-C.  Roberto [name] is a 73 y.o. male who presented to the ED today with urinary retention and hematuria.  He had complaints of a voiding of a clot as well as mid back pain bilaterally.  He has had extensive workup prior to assumption of care including lab evaluation does not show any abnormalities, and CT renal stone study that did show some nephrolithiasis that was nonobstructing however there was concern for a mass found in the bladder that were recommended to have cystoscopy performed.  At time of assumption of care was pending urology consultation.  He has had continuous bladder irrigation prior to handoff of care.     Radiology:  All images reviewed independently. Agree with radiology report at this time.   CT Renal Stone Study Result Date: 12/02/2023 CLINICAL DATA:  Abdominal/flank pain, stone suspected, frank hematuria, urinary retention. EXAM: CT ABDOMEN AND PELVIS WITHOUT CONTRAST TECHNIQUE: Multidetector CT imaging of the abdomen and pelvis was performed following the standard protocol without IV contrast. RADIATION DOSE REDUCTION: This exam was performed according to the departmental dose-optimization program which includes automated exposure control, adjustment of the mA and/or kV according to patient size and/or use of iterative reconstruction technique. COMPARISON:  None Available. FINDINGS: Lower chest: No acute abnormality. Hepatobiliary: Unremarkable liver. Normal gallbladder. No biliary dilation. Pancreas: Unremarkable. Spleen: Unremarkable.  Adrenals/Urinary Tract: Normal adrenal glands. Nonobstructing left nephrolithiasis versus vascular calcifications. No or hydronephrosis. Irregular hyperdensity in the posterior bladder is incompletely evaluated without IV contrast. This could represent urothelial malignancy or clot (circa series 2/image 68). There is a punctate calcification in the hyperdense mass. Stomach/Bowel: Normal caliber large and small bowel. No bowel wall thickening. The appendix is normal. Posterior gastric diverticulum. Vascular/Lymphatic: Aortic atherosclerosis. No enlarged abdominal or pelvic lymph nodes. Reproductive: Unremarkable. Other: No free intraperitoneal fluid or air. Musculoskeletal: No acute fracture. IMPRESSION: 1. Irregular hyperdensity in the posterior bladder is incompletely evaluated without IV contrast. This could represent urothelial malignancy or clot. Recommend cystoscopy. 2. Nonobstructing left nephrolithiasis versus vascular calcifications. 3. Aortic Atherosclerosis (ICD10-I70.0). Electronically Signed   By: Norman Gatlin M.D.   On: 12/02/2023 01:16      Consults: Dr. Shane from urology assess patient as noted in their consultation note.  Reassessment and Plan:   Based on neurology consultation, if resumed bladder irrigation with exchanged catheter.  They are going in for CT urogram, and plan if this is unremarkable to discharge with outpatient follow-up for continued evaluation of likely bladder cancer.  After continued CBI, urology reviewed the findings from CT as well as reevaluated the patient.  Due to continued bleeding, they plan to admit to medicine for them to continue to follow and continue CBI.  Plan to consult hospitalist for admission, patient aware and understanding, agrees with admission at this time.         Myriam Dorn BROCKS, PA 12/02/23 1114    Melvenia Motto, MD 12/02/23 [phone]

## 2023-12-02 NOTE — ED Notes (Addendum)
 This RN, FREDRIK Baller, and A. Perri attempted to manually irrigate bladder. Attempts were temporarily successful. Pt in discomfort, attempts stopped. This RN notified L. McCauley of unsuccessful irrigation attempts. FREDRIK High instructed this RN to stop CBI at this time and allow for urology to see patient. This RN stopped CBI and notified pt of plan.

## 2023-12-02 NOTE — ED Notes (Signed)
 CBI will clear then red blood clots noted then 15 minutes later be clear again then small red blood clots noted

## 2023-12-03 DIAGNOSIS — R310 Gross hematuria: Secondary | ICD-10-CM | POA: Diagnosis present

## 2023-12-03 DIAGNOSIS — N3289 Other specified disorders of bladder: Secondary | ICD-10-CM | POA: Diagnosis present

## 2023-12-03 DIAGNOSIS — D62 Acute posthemorrhagic anemia: Secondary | ICD-10-CM | POA: Diagnosis not present

## 2023-12-03 DIAGNOSIS — R109 Unspecified abdominal pain: Secondary | ICD-10-CM | POA: Diagnosis present

## 2023-12-03 DIAGNOSIS — C679 Malignant neoplasm of bladder, unspecified: Secondary | ICD-10-CM | POA: Diagnosis present

## 2023-12-03 DIAGNOSIS — R339 Retention of urine, unspecified: Secondary | ICD-10-CM | POA: Diagnosis present

## 2023-12-03 DIAGNOSIS — Z87891 Personal history of nicotine dependence: Secondary | ICD-10-CM | POA: Diagnosis not present

## 2023-12-03 DIAGNOSIS — J4489 Other specified chronic obstructive pulmonary disease: Secondary | ICD-10-CM | POA: Diagnosis present

## 2023-12-03 DIAGNOSIS — M546 Pain in thoracic spine: Secondary | ICD-10-CM | POA: Diagnosis present

## 2023-12-03 DIAGNOSIS — D494 Neoplasm of unspecified behavior of bladder: Secondary | ICD-10-CM | POA: Diagnosis not present

## 2023-12-03 DIAGNOSIS — I10 Essential (primary) hypertension: Secondary | ICD-10-CM

## 2023-12-03 DIAGNOSIS — Z79899 Other long term (current) drug therapy: Secondary | ICD-10-CM | POA: Diagnosis not present

## 2023-12-03 DIAGNOSIS — E782 Mixed hyperlipidemia: Secondary | ICD-10-CM | POA: Diagnosis present

## 2023-12-03 DIAGNOSIS — Z7951 Long term (current) use of inhaled steroids: Secondary | ICD-10-CM | POA: Diagnosis not present

## 2023-12-03 DIAGNOSIS — J449 Chronic obstructive pulmonary disease, unspecified: Secondary | ICD-10-CM

## 2023-12-03 DIAGNOSIS — Z800 Family history of malignant neoplasm of digestive organs: Secondary | ICD-10-CM | POA: Diagnosis not present

## 2023-12-03 DIAGNOSIS — K5900 Constipation, unspecified: Secondary | ICD-10-CM | POA: Diagnosis present

## 2023-12-03 LAB — CBC WITH DIFFERENTIAL/PLATELET
Abs Immature Granulocytes: 0.04 K/uL (ref 0.00–0.07)
Basophils Absolute: 0.1 K/uL (ref 0.0–0.1)
Basophils Relative: 1 %
Eosinophils Absolute: 0.1 K/uL (ref 0.0–0.5)
Eosinophils Relative: 1 %
HCT: 37.4 % — ABNORMAL LOW (ref 39.0–52.0)
Hemoglobin: 12.5 g/dL — ABNORMAL LOW (ref 13.0–17.0)
Immature Granulocytes: 0 %
Lymphocytes Relative: 10 %
Lymphs Abs: 1.1 K/uL (ref 0.7–4.0)
MCH: 32.3 pg (ref 26.0–34.0)
MCHC: 33.4 g/dL (ref 30.0–36.0)
MCV: 96.6 fL (ref 80.0–100.0)
Monocytes Absolute: 1 K/uL (ref 0.1–1.0)
Monocytes Relative: 9 %
Neutro Abs: 8.7 K/uL — ABNORMAL HIGH (ref 1.7–7.7)
Neutrophils Relative %: 79 %
Platelets: 256 K/uL (ref 150–400)
RBC: 3.87 MIL/uL — ABNORMAL LOW (ref 4.22–5.81)
RDW: 12.7 % (ref 11.5–15.5)
WBC: 11 K/uL — ABNORMAL HIGH (ref 4.0–10.5)
nRBC: 0 % (ref 0.0–0.2)

## 2023-12-03 LAB — MAGNESIUM: Magnesium: 2.2 mg/dL (ref 1.7–2.4)

## 2023-12-03 LAB — BASIC METABOLIC PANEL WITH GFR
Anion gap: 10 (ref 5–15)
BUN: 16 mg/dL (ref 8–23)
CO2: 25 mmol/L (ref 22–32)
Calcium: 8.6 mg/dL — ABNORMAL LOW (ref 8.9–10.3)
Chloride: 102 mmol/L (ref 98–111)
Creatinine, Ser: 0.77 mg/dL (ref 0.61–1.24)
GFR, Estimated: >60 mL/min (ref >60)
Glucose, Bld: 140 mg/dL — ABNORMAL HIGH (ref 70–99)
Potassium: 3.8 mmol/L (ref 3.5–5.1)
Sodium: 137 mmol/L (ref 135–145)

## 2023-12-03 MED ORDER — SENNOSIDES-DOCUSATE SODIUM 8.6-50 MG PO TABS
1.0000 | ORAL_TABLET | Freq: Two times a day (BID) | ORAL | Status: DC
  Administered 2023-12-03 – 2023-12-05 (×3): 1 via ORAL
  Filled 2023-12-03 (×3): qty 1

## 2023-12-03 MED ORDER — ALBUTEROL SULFATE (2.5 MG/3ML) 0.083% IN NEBU: 3.0000 mL | INHALATION_SOLUTION | RESPIRATORY_TRACT | Status: DC | PRN

## 2023-12-03 MED ORDER — HYOSCYAMINE SULFATE 0.125 MG PO TBDP
0.1250 mg | ORAL_TABLET | Freq: Four times a day (QID) | ORAL | Status: DC | PRN
  Administered 2023-12-03 – 2023-12-05 (×4): 0.125 mg via SUBLINGUAL
  Filled 2023-12-03 (×8): qty 1

## 2023-12-03 MED ORDER — PANTOPRAZOLE SODIUM 40 MG PO TBEC
40.0000 mg | DELAYED_RELEASE_TABLET | Freq: Every day | ORAL | Status: DC
  Administered 2023-12-03 – 2023-12-05 (×3): 40 mg via ORAL
  Filled 2023-12-03 (×3): qty 1

## 2023-12-03 MED ORDER — POLYETHYLENE GLYCOL 3350 17 G PO PACK
17.0000 g | PACK | Freq: Every day | ORAL | Status: DC
  Administered 2023-12-03: 17 g via ORAL
  Filled 2023-12-03: qty 1

## 2023-12-03 MED ORDER — ARFORMOTEROL TARTRATE 15 MCG/2ML IN NEBU
15.0000 ug | INHALATION_SOLUTION | Freq: Two times a day (BID) | RESPIRATORY_TRACT | Status: DC
  Administered 2023-12-03 – 2023-12-05 (×4): 15 ug via RESPIRATORY_TRACT
  Filled 2023-12-03 (×5): qty 2

## 2023-12-03 NOTE — Progress Notes (Signed)
 PROGRESS NOTE    Roberto [name]  FMW:[id number] DOB: 08-18-1950 DOA: 12/01/2023 PCP: Delayne Artist PARAS, MD   Chief Complaint  Patient presents with   Urinary Retention    Brief Narrative: Patient pleasant 73 year old gentleman history of allergic conjunctivitis, allergic rhinitis, asthma, COPD, history of tobacco use, hyperlipidemia hypertension presented to the ED with urinary retention after a week of gross hematuria.  Patient seen in consultation by urology and patient placed on continuous bladder irrigation.  Patient with continuous gross hematuria and patient for TURBT on 12/04/2023 per urology.   Assessment & Plan:   Principal Problem:   Gross hematuria Active Problems:   Chronic obstructive pulmonary disease (HCC)   Mixed hyperlipidemia   Primary hypertension   ABLA (acute blood loss anemia)  #1 gross hematuria  - Patient noted to have presenting urinary retention, gross hematuria. - CT renal stone protocol done with irregular hypodensity in the posterior bladder which could represent urothelial malignancy or clot.  Nonobstructing left nephrolithiasis versus vascular calcification. - CT hematuria workup done with ill-defined subcu enhancing mass within the left posterior bladder measuring 3.1 x 2.0 cm suspicious for primary bladder neoplasm with likely extension into the distal left ureter.  Lobulated filling defects within the bladder suspicious for additional bladder neoplasms.  No suspicious intraluminal filling defects within the right ureter noting a short segment of mid right ureter is not well-opacified.  No definite filling defects within the left renal pelvis noted in the lower pole pelvis/calyx appears mildly prominent.  Nonobstructing 4 mm stone in the left lower pole. -Patient seen and consult by urology and placed on CBI. - Patient with ongoing gross hematuria and patient for TURBT tomorrow, 12/04/2023 by urology for further evaluation. - Hemoglobin currently stable  at 12.5. - Follow.  2.  Acute blood loss anemia -Secondary to problem #1. - Follow H&H. - Transfusion threshold hemoglobin < 8.  3.  Bladder spasms -Continue scheduled oxybutynin . - Hyoscyamine  as needed.  4.  COPD -Stable. - Place on Brovana  twice daily, Pulmicort  twice daily. - Albuterol  nebs as needed.  5.  Hyperlipidemia -Continue statin.  6.  Hypertension -Continue Norvasc  5 mg daily   DVT prophylaxis: SCDs Code Status: Full Family Communication: Updated patient.  No family at bedside. Disposition: Likely home when clinically improved and cleared by urology.  Status is: Inpatient The patient will require care spanning > 2 midnights and should be moved to inpatient because: Severity of illness   Consultants:  Urology: Dr. Shane 12/02/2023  Procedures:  CT renal stone protocol 12/02/2023 Chest x-ray 12/02/2023 CT hematuria workup 12/02/2023  Antimicrobials:  Anti-infectives (From admission, onward)    None         Subjective: Patient sitting up in bed on the telephone.  Foley catheter bag with hematuria noted.  Patient denies any chest pain or shortness of breath.  No abdominal pain.  Stated he saw urologist who are contemplating on doing surgery/cystoscopy tomorrow for further evaluation if patient with ongoing hematuria.  Objective: Vitals:   12/02/23 2148 12/03/23 0103 12/03/23 0743 12/03/23 1133  BP: (!) 162/93 (!) 146/76    Pulse: 93 87    Resp: 17 17    Temp: 97.6 F (36.4 C) 98.4 F (36.9 C)    TempSrc:      SpO2: 100% 98% 92% 92%  Weight:      Height:        Intake/Output Summary (Last 24 hours) at 12/03/2023 1158 Last data filed at 12/03/2023 9166 Gross  per 24 hour  Intake 2365.18 ml  Output 82649 ml  Net -14984.82 ml   Filed Weights   12/01/23 1801  Weight: 96.2 kg    Examination:  General exam: Appears calm and comfortable  Respiratory system: Clear to auscultation. Respiratory effort normal. Cardiovascular system: S1 & S2  heard, RRR. No JVD, murmurs, rubs, gallops or clicks. No pedal edema. Gastrointestinal system: Abdomen is nondistended, soft and nontender. No organomegaly or masses felt. Normal bowel sounds heard. Central nervous system: Alert and oriented. No focal neurological deficits. Extremities: Symmetric 5 x 5 power. Skin: No rashes, lesions or ulcers Psychiatry: Judgement and insight appear normal. Mood & affect appropriate.     Data Reviewed: I have personally reviewed following labs and imaging studies  CBC: Recent Labs  Lab 12/01/23 1806 12/02/23 1224 12/02/23 1643  WBC 7.9 10.9*  --   NEUTROABS 5.5  --   --   HGB 13.5 12.6* 12.3*  HCT 40.8 36.4* 37.1*  MCV 95.8 94.8  --   PLT 244 218  --     Basic Metabolic Panel: Recent Labs  Lab 12/01/23 1806 12/02/23 1224  NA 140 136  K 4.3 3.7  CL 102 102  CO2 26 24  GLUCOSE 110* 126*  BUN 21 14  CREATININE 0.88 0.86  CALCIUM  10.0 8.9    GFR: Estimated Creatinine Clearance: 89.2 mL/min (by C-G formula based on SCr of 0.86 mg/dL).  Liver Function Tests: Recent Labs  Lab 12/01/23 1806  AST 23  ALT 26  ALKPHOS 75  BILITOT 0.6  PROT 8.1  ALBUMIN 4.5    CBG: No results for input(s): GLUCAP in the last 168 hours.   No results found for this or any previous visit (from the past 240 hours).       Radiology Studies: DG Chest Port 1 View Result Date: 12/02/2023 CLINICAL DATA:  Asthma.  Preop cardiovascular exam. EXAM: PORTABLE CHEST 1 VIEW COMPARISON:  December 28, 2013. FINDINGS: The heart size and mediastinal contours are within normal limits. Minimal bibasilar subsegmental atelectasis or scarring is noted. The visualized skeletal structures are unremarkable. IMPRESSION: Minimal bibasilar subsegmental atelectasis or scarring. Electronically Signed   By: Lynwood Landy Raddle M.D.   On: 12/02/2023 12:58   CT HEMATURIA WORKUP Result Date: 12/02/2023 CLINICAL DATA:  Urinary retention and hematuria with clots. Earlier same day CT  revealed potential bladder tumors. Hematuria protocol for evaluation of upper tract disease. EXAM: CT ABDOMEN AND PELVIS WITHOUT AND WITH CONTRAST TECHNIQUE: Multidetector CT imaging of the abdomen and pelvis was performed following the standard protocol before and following the bolus administration of intravenous contrast. RADIATION DOSE REDUCTION: This exam was performed according to the departmental dose-optimization program which includes automated exposure control, adjustment of the mA and/or kV according to patient size and/or use of iterative reconstruction technique. CONTRAST:  OMNIPAQUE  IOHEXOL  300 MG/ML  SOLN COMPARISON:  Earlier same day CT abdomen and pelvis FINDINGS: Lower chest: Bibasilar linear atelectasis/scarring. Partially imaged subpleural right lower lobe nodule measures 5 mm (7:1). No pleural effusion or pneumothorax demonstrated. Partially imaged heart size is normal. Hepatobiliary: Subcentimeter segment 2 hypodensity, too small to characterize. No intra or extrahepatic biliary ductal dilation. Normal gallbladder. Pancreas: No focal lesions or main ductal dilation. Spleen: Normal in size without focal abnormality. Adrenals/Urinary Tract: No adrenal nodules. 4 mm nonobstructing stone in the left lower pole. No hydronephrosis. Right interpolar 1.4 cm simple/minimally complicated cyst. No specific follow-up imaging recommended. Additional subcentimeter hypodensities, too small to characterize  but also likely cysts. Prominent left lower pole pelvis/calyx (19:51). Urinary catheter in-situ. Ill-defined, subtly enhancing mass within the left posterior bladder measures 3.1 x 2.0 cm (13:71). On the delayed phase images, there are additional lobulated filling defects. In the midline posterior bladder the filling defect measures 3.4 x 2.2 cm (23:71). No suspicious intraluminal filling defects within the right ureter, noting a short-segment of the mid right ureter is not well opacified. The proximal  and distal left ureter and ureterovesical junction are not well opacified with suspicion for bladder mass extension into the distal left ureter (23:71). Stomach/Bowel: Gastric fundal diverticulum. No evidence of bowel wall thickening, distention, or inflammatory changes. Colonic diverticulosis without acute diverticulitis. Normal appendix. Vascular/Lymphatic: Aortic atherosclerosis. No enlarged abdominal or pelvic lymph nodes. Reproductive: Mildly enlarged prostate gland. Other: No free fluid, fluid collection, or free air. Musculoskeletal: Unchanged round sclerotic focus within the right iliac (3:47). Multilevel degenerative changes of the partially imaged thoracic and lumbar spine. IMPRESSION: 1. Ill-defined, subtly enhancing mass within the left posterior bladder measures 3.1 x 2.0 cm, suspicious for primary bladder neoplasm, with likely extension into the distal left ureter. 2. Additional lobulated filling defects within the bladder on the delayed phase images, suspicious for additional bladder neoplasms. 3. No suspicious intraluminal filling defects within the right ureter, noting a short-segment of the mid right ureter is not well opacified. No definite filling defects within the left renal pelvis, noting the lower pole pelvis/calyx appears mildly prominent. 4. No enlarged abdominal or pelvic lymph nodes. 5. Partially imaged subpleural right lower lobe nodule measures 5 mm, indeterminate. 6. Nonobstructing 4 mm stone in the left lower pole. 7.  Aortic Atherosclerosis (ICD10-I70.0). Electronically Signed   By: Limin  Xu M.D.   On: 12/02/2023 09:27   CT Renal Stone Study Result Date: 12/02/2023 CLINICAL DATA:  Abdominal/flank pain, stone suspected, frank hematuria, urinary retention. EXAM: CT ABDOMEN AND PELVIS WITHOUT CONTRAST TECHNIQUE: Multidetector CT imaging of the abdomen and pelvis was performed following the standard protocol without IV contrast. RADIATION DOSE REDUCTION: This exam was performed  according to the departmental dose-optimization program which includes automated exposure control, adjustment of the mA and/or kV according to patient size and/or use of iterative reconstruction technique. COMPARISON:  None Available. FINDINGS: Lower chest: No acute abnormality. Hepatobiliary: Unremarkable liver. Normal gallbladder. No biliary dilation. Pancreas: Unremarkable. Spleen: Unremarkable. Adrenals/Urinary Tract: Normal adrenal glands. Nonobstructing left nephrolithiasis versus vascular calcifications. No or hydronephrosis. Irregular hyperdensity in the posterior bladder is incompletely evaluated without IV contrast. This could represent urothelial malignancy or clot (circa series 2/image 68). There is a punctate calcification in the hyperdense mass. Stomach/Bowel: Normal caliber large and small bowel. No bowel wall thickening. The appendix is normal. Posterior gastric diverticulum. Vascular/Lymphatic: Aortic atherosclerosis. No enlarged abdominal or pelvic lymph nodes. Reproductive: Unremarkable. Other: No free intraperitoneal fluid or air. Musculoskeletal: No acute fracture. IMPRESSION: 1. Irregular hyperdensity in the posterior bladder is incompletely evaluated without IV contrast. This could represent urothelial malignancy or clot. Recommend cystoscopy. 2. Nonobstructing left nephrolithiasis versus vascular calcifications. 3. Aortic Atherosclerosis (ICD10-I70.0). Electronically Signed   By: Norman Gatlin M.D.   On: 12/02/2023 01:16        Scheduled Meds:  amLODipine   5 mg Oral Daily   arformoterol   15 mcg Nebulization BID   budesonide  (PULMICORT ) nebulizer solution  0.25 mg Nebulization BID   lidocaine   1 Application Urethral Once   montelukast   10 mg Oral QHS   oxybutynin   5 mg Oral TID   pantoprazole   40 mg Oral Q0600   rosuvastatin   20 mg Oral QHS   Continuous Infusions:  sodium chloride  irrigation 0 mL (12/02/23 0559)     LOS: 0 days    Time spent: 40 minutes    Toribio Hummer, MD [facility]   To contact the attending provider between 7A-7P or the covering provider during after hours 7P-7A, please log into the web site [email or web address] and access using universal [place] password for that web site. If you do not have the password, please call the hospital operator.  12/03/2023, 11:58 AM

## 2023-12-03 NOTE — Progress Notes (Signed)
   12/03/23 1010  TOC Brief Assessment  Insurance and Status Reviewed  Patient has primary care physician Yes  Home environment has been reviewed Resides in single family home with spouse  Prior level of function: Independent with ADLs at baseline  Prior/Current Home Services No current home services  Social Drivers of Health Review SDOH reviewed no interventions necessary  Readmission risk has been reviewed Yes  Transition of care needs no transition of care needs at this time

## 2023-12-03 NOTE — Progress Notes (Signed)
 Pt continue to bleed we discussed risks benefits and alternatives to cysto clot fulguration and TURBT. Mentioned the possibility of finding cancer need for long term catheter, risk of bladder perforation needing an open procedure. We dsicussed the need for stent.  PT voiced his understanding and consent was obtained.  Plan: - NPO at midnight - continue CBI - will plan for TURBT tomorrow.

## 2023-12-03 NOTE — Progress Notes (Signed)
 Subjective: Still having bladder spasms, no abdomin pain, no F/C/NV.   Objective: Vital signs in last 24 hours: Temp:  [97.6 F (36.4 C)-98.4 F (36.9 C)] 98.4 F (36.9 C) (07/16 0103) Pulse Rate:  [84-98] 87 (07/16 0103) Resp:  [16-20] 17 (07/16 0103) BP: (140-189)/(73-103) 146/76 (07/16 0103) SpO2:  [96 %-100 %] 98 % (07/16 0103)  Intake/Output from previous day: 07/15 0701 - 07/16 0700 In: 8765.2 [P.O.:360; IV Piggyback:1005.2] Out: 74349 [Urine:25650] Intake/Output this shift: No intake/output data recorded.  Physical Exam:  General: Alert and oriented CV: RRR Lungs: Clear Abdomen: Soft, ND, ATTP;  GU: urine clear on moderate CBI but darkens with bladder spasms  Lab Results: Recent Labs    12/01/23 1806 12/02/23 1224 12/02/23 1643  HGB 13.5 12.6* 12.3*  HCT 40.8 36.4* 37.1*   BMET Recent Labs    12/01/23 1806 12/02/23 1224  NA 140 136  K 4.3 3.7  CL 102 102  CO2 26 24  GLUCOSE 110* 126*  BUN 21 14  CREATININE 0.88 0.86  CALCIUM  10.0 8.9     Studies/Results: DG Chest Port 1 View Result Date: 12/02/2023 CLINICAL DATA:  Asthma.  Preop cardiovascular exam. EXAM: PORTABLE CHEST 1 VIEW COMPARISON:  December 28, 2013. FINDINGS: The heart size and mediastinal contours are within normal limits. Minimal bibasilar subsegmental atelectasis or scarring is noted. The visualized skeletal structures are unremarkable. IMPRESSION: Minimal bibasilar subsegmental atelectasis or scarring. Electronically Signed   By: Lynwood Landy Raddle M.D.   On: 12/02/2023 12:58   CT HEMATURIA WORKUP Result Date: 12/02/2023 CLINICAL DATA:  Urinary retention and hematuria with clots. Earlier same day CT revealed potential bladder tumors. Hematuria protocol for evaluation of upper tract disease. EXAM: CT ABDOMEN AND PELVIS WITHOUT AND WITH CONTRAST TECHNIQUE: Multidetector CT imaging of the abdomen and pelvis was performed following the standard protocol before and following the bolus  administration of intravenous contrast. RADIATION DOSE REDUCTION: This exam was performed according to the departmental dose-optimization program which includes automated exposure control, adjustment of the mA and/or kV according to patient size and/or use of iterative reconstruction technique. CONTRAST:  OMNIPAQUE  IOHEXOL  300 MG/ML  SOLN COMPARISON:  Earlier same day CT abdomen and pelvis FINDINGS: Lower chest: Bibasilar linear atelectasis/scarring. Partially imaged subpleural right lower lobe nodule measures 5 mm (7:1). No pleural effusion or pneumothorax demonstrated. Partially imaged heart size is normal. Hepatobiliary: Subcentimeter segment 2 hypodensity, too small to characterize. No intra or extrahepatic biliary ductal dilation. Normal gallbladder. Pancreas: No focal lesions or main ductal dilation. Spleen: Normal in size without focal abnormality. Adrenals/Urinary Tract: No adrenal nodules. 4 mm nonobstructing stone in the left lower pole. No hydronephrosis. Right interpolar 1.4 cm simple/minimally complicated cyst. No specific follow-up imaging recommended. Additional subcentimeter hypodensities, too small to characterize but also likely cysts. Prominent left lower pole pelvis/calyx (19:51). Urinary catheter in-situ. Ill-defined, subtly enhancing mass within the left posterior bladder measures 3.1 x 2.0 cm (13:71). On the delayed phase images, there are additional lobulated filling defects. In the midline posterior bladder the filling defect measures 3.4 x 2.2 cm (23:71). No suspicious intraluminal filling defects within the right ureter, noting a short-segment of the mid right ureter is not well opacified. The proximal and distal left ureter and ureterovesical junction are not well opacified with suspicion for bladder mass extension into the distal left ureter (23:71). Stomach/Bowel: Gastric fundal diverticulum. No evidence of bowel wall thickening, distention, or inflammatory changes. Colonic  diverticulosis without acute diverticulitis. Normal appendix. Vascular/Lymphatic: Aortic atherosclerosis.  No enlarged abdominal or pelvic lymph nodes. Reproductive: Mildly enlarged prostate gland. Other: No free fluid, fluid collection, or free air. Musculoskeletal: Unchanged round sclerotic focus within the right iliac (3:47). Multilevel degenerative changes of the partially imaged thoracic and lumbar spine. IMPRESSION: 1. Ill-defined, subtly enhancing mass within the left posterior bladder measures 3.1 x 2.0 cm, suspicious for primary bladder neoplasm, with likely extension into the distal left ureter. 2. Additional lobulated filling defects within the bladder on the delayed phase images, suspicious for additional bladder neoplasms. 3. No suspicious intraluminal filling defects within the right ureter, noting a short-segment of the mid right ureter is not well opacified. No definite filling defects within the left renal pelvis, noting the lower pole pelvis/calyx appears mildly prominent. 4. No enlarged abdominal or pelvic lymph nodes. 5. Partially imaged subpleural right lower lobe nodule measures 5 mm, indeterminate. 6. Nonobstructing 4 mm stone in the left lower pole. 7.  Aortic Atherosclerosis (ICD10-I70.0). Electronically Signed   By: Limin  Xu M.D.   On: 12/02/2023 09:27   CT Renal Stone Study Result Date: 12/02/2023 CLINICAL DATA:  Abdominal/flank pain, stone suspected, frank hematuria, urinary retention. EXAM: CT ABDOMEN AND PELVIS WITHOUT CONTRAST TECHNIQUE: Multidetector CT imaging of the abdomen and pelvis was performed following the standard protocol without IV contrast. RADIATION DOSE REDUCTION: This exam was performed according to the departmental dose-optimization program which includes automated exposure control, adjustment of the mA and/or kV according to patient size and/or use of iterative reconstruction technique. COMPARISON:  None Available. FINDINGS: Lower chest: No acute abnormality.  Hepatobiliary: Unremarkable liver. Normal gallbladder. No biliary dilation. Pancreas: Unremarkable. Spleen: Unremarkable. Adrenals/Urinary Tract: Normal adrenal glands. Nonobstructing left nephrolithiasis versus vascular calcifications. No or hydronephrosis. Irregular hyperdensity in the posterior bladder is incompletely evaluated without IV contrast. This could represent urothelial malignancy or clot (circa series 2/image 68). There is a punctate calcification in the hyperdense mass. Stomach/Bowel: Normal caliber large and small bowel. No bowel wall thickening. The appendix is normal. Posterior gastric diverticulum. Vascular/Lymphatic: Aortic atherosclerosis. No enlarged abdominal or pelvic lymph nodes. Reproductive: Unremarkable. Other: No free intraperitoneal fluid or air. Musculoskeletal: No acute fracture. IMPRESSION: 1. Irregular hyperdensity in the posterior bladder is incompletely evaluated without IV contrast. This could represent urothelial malignancy or clot. Recommend cystoscopy. 2. Nonobstructing left nephrolithiasis versus vascular calcifications. 3. Aortic Atherosclerosis (ICD10-I70.0). Electronically Signed   By: Norman Gatlin M.D.   On: 12/02/2023 01:16    Assessment/Plan: 73 year old male with gross hematuria suspect from bladder mass now draining. CTU concern for L posterior bladder mass.   # Gross hematuria: - continue CBI - will irrigate again today if still bleeding will continue TXA - if continue bleeding will continue OR tomorrow - no current indication for transfusion  - transfuse as needed   # Potential bladder cancer  - outpatient cystoscopy  - depending on course of GH may go to OR for evaluation during this hospitalization.   # Bladder spasm - schedule oxybutynin       LOS: 0 days   Jackey Pea MD 12/03/2023, 7:04 AM [facility]

## 2023-12-03 NOTE — Progress Notes (Signed)
 Mobility Specialist - Progress Note   12/03/23 1315  Mobility  Activity Ambulated independently in hallway  Level of Assistance Independent  Assistive Device None  Distance Ambulated (ft) 350 ft  Activity Response Tolerated well  Mobility Referral Yes  Mobility visit 1 Mobility  Mobility Specialist Start Time (ACUTE ONLY) 1305  Mobility Specialist Stop Time (ACUTE ONLY) 1315  Mobility Specialist Time Calculation (min) (ACUTE ONLY) 10 min   Pt received in bed and agreeable to mobility. No complaints during session. Pt to bed after session with all needs met.    [facility]

## 2023-12-04 ENCOUNTER — Inpatient Hospital Stay (HOSPITAL_COMMUNITY)

## 2023-12-04 ENCOUNTER — Encounter (HOSPITAL_COMMUNITY)
Admission: EM | Disposition: A | Discharge status: Home / Self Care | Payer: Self-pay | Source: Home / Self Care | Attending: Internal Medicine

## 2023-12-04 DIAGNOSIS — D494 Neoplasm of unspecified behavior of bladder: Secondary | ICD-10-CM

## 2023-12-04 HISTORY — PX: TRANSURETHRAL RESECTION OF BLADDER TUMOR: SHX2575

## 2023-12-04 LAB — CBC WITH DIFFERENTIAL/PLATELET
Abs Immature Granulocytes: 0.04 K/uL (ref 0.00–0.07)
Basophils Absolute: 0 K/uL (ref 0.0–0.1)
Basophils Relative: 0 %
Eosinophils Absolute: 0.1 K/uL (ref 0.0–0.5)
Eosinophils Relative: 1 %
HCT: 39.6 % (ref 39.0–52.0)
Hemoglobin: 13.1 g/dL (ref 13.0–17.0)
Immature Granulocytes: 0 %
Lymphocytes Relative: 13 %
Lymphs Abs: 1.2 K/uL (ref 0.7–4.0)
MCH: 32.3 pg (ref 26.0–34.0)
MCHC: 33.1 g/dL (ref 30.0–36.0)
MCV: 97.5 fL (ref 80.0–100.0)
Monocytes Absolute: 0.8 K/uL (ref 0.1–1.0)
Monocytes Relative: 9 %
Neutro Abs: 7.2 K/uL (ref 1.7–7.7)
Neutrophils Relative %: 77 %
Platelets: 214 K/uL (ref 150–400)
RBC: 4.06 MIL/uL — ABNORMAL LOW (ref 4.22–5.81)
RDW: 12.5 % (ref 11.5–15.5)
WBC: 9.4 K/uL (ref 4.0–10.5)
nRBC: 0 % (ref 0.0–0.2)

## 2023-12-04 LAB — SURGICAL PCR SCREEN
MRSA, PCR: NEGATIVE
Staphylococcus aureus: NEGATIVE

## 2023-12-04 LAB — BASIC METABOLIC PANEL WITH GFR
Anion gap: 11 (ref 5–15)
BUN: 15 mg/dL (ref 8–23)
CO2: 24 mmol/L (ref 22–32)
Calcium: 9.1 mg/dL (ref 8.9–10.3)
Chloride: 102 mmol/L (ref 98–111)
Creatinine, Ser: 0.77 mg/dL (ref 0.61–1.24)
GFR, Estimated: >60 mL/min (ref >60)
Glucose, Bld: 119 mg/dL — ABNORMAL HIGH (ref 70–99)
Potassium: 3.8 mmol/L (ref 3.5–5.1)
Sodium: 137 mmol/L (ref 135–145)

## 2023-12-04 LAB — HEMOGLOBIN AND HEMATOCRIT, BLOOD
HCT: 36.5 % — ABNORMAL LOW (ref 39.0–52.0)
Hemoglobin: 12.2 g/dL — ABNORMAL LOW (ref 13.0–17.0)

## 2023-12-04 SURGERY — TURBT (TRANSURETHRAL RESECTION OF BLADDER TUMOR)
Anesthesia: General

## 2023-12-04 MED ORDER — PHENYLEPHRINE 80 MCG/ML (10ML) SYRINGE FOR IV PUSH (FOR BLOOD PRESSURE SUPPORT)
PREFILLED_SYRINGE | INTRAVENOUS | Status: DC | PRN
  Administered 2023-12-04 (×2): 80 ug via INTRAVENOUS

## 2023-12-04 MED ORDER — ROCURONIUM BROMIDE 10 MG/ML (PF) SYRINGE
PREFILLED_SYRINGE | INTRAVENOUS | Status: AC
Start: 2023-12-04 — End: 2023-12-04
  Filled 2023-12-04: qty 10

## 2023-12-04 MED ORDER — DEXMEDETOMIDINE HCL IN NACL 80 MCG/20ML IV SOLN
INTRAVENOUS | Status: AC
  Filled 2023-12-04: qty 20

## 2023-12-04 MED ORDER — CEFAZOLIN SODIUM-DEXTROSE 2-4 GM/100ML-% IV SOLN
2.0000 g | Freq: Once | INTRAVENOUS | Status: AC
  Administered 2023-12-04: 2 g via INTRAVENOUS

## 2023-12-04 MED ORDER — LACTATED RINGERS IV SOLN: INTRAVENOUS | Status: DC | PRN

## 2023-12-04 MED ORDER — LIDOCAINE HCL (PF) 2 % IJ SOLN
INTRAMUSCULAR | Status: DC | PRN
  Administered 2023-12-04: 60 mg via INTRADERMAL

## 2023-12-04 MED ORDER — HYDRALAZINE HCL 20 MG/ML IJ SOLN
10.0000 mg | Freq: Four times a day (QID) | INTRAMUSCULAR | Status: DC | PRN
  Administered 2023-12-04: 10 mg via INTRAVENOUS
  Filled 2023-12-04: qty 1

## 2023-12-04 MED ORDER — DEXAMETHASONE SODIUM PHOSPHATE 10 MG/ML IJ SOLN
INTRAMUSCULAR | Status: DC | PRN
Start: 2023-12-04 — End: 2023-12-04
  Administered 2023-12-04: 5 mg via INTRAVENOUS

## 2023-12-04 MED ORDER — PHENYLEPHRINE 80 MCG/ML (10ML) SYRINGE FOR IV PUSH (FOR BLOOD PRESSURE SUPPORT)
PREFILLED_SYRINGE | INTRAVENOUS | Status: AC
Start: 2023-12-04 — End: 2023-12-04
  Filled 2023-12-04: qty 10

## 2023-12-04 MED ORDER — CEFAZOLIN SODIUM-DEXTROSE 2-4 GM/100ML-% IV SOLN
INTRAVENOUS | Status: AC
  Filled 2023-12-04: qty 100

## 2023-12-04 MED ORDER — FENTANYL CITRATE (PF) 100 MCG/2ML IJ SOLN
INTRAMUSCULAR | Status: AC
  Filled 2023-12-04: qty 2

## 2023-12-04 MED ORDER — SUGAMMADEX SODIUM 200 MG/2ML IV SOLN
INTRAVENOUS | Status: DC | PRN
Start: 2023-12-04 — End: 2023-12-04
  Administered 2023-12-04: 200 mg via INTRAVENOUS

## 2023-12-04 MED ORDER — HYDROMORPHONE HCL 1 MG/ML IJ SOLN
1.0000 mg | INTRAMUSCULAR | Status: DC | PRN
  Administered 2023-12-04: 2 mg via INTRAVENOUS
  Administered 2023-12-04: 1 mg via INTRAVENOUS
  Administered 2023-12-05 (×2): 2 mg via INTRAVENOUS
  Filled 2023-12-04: qty 1
  Filled 2023-12-04 (×3): qty 2

## 2023-12-04 MED ORDER — SODIUM CHLORIDE 0.9 % IR SOLN
Status: DC | PRN
  Administered 2023-12-04 (×5): 3000 mL via INTRAVESICAL

## 2023-12-04 MED ORDER — EPHEDRINE 5 MG/ML INJ
INTRAVENOUS | Status: AC
  Filled 2023-12-04: qty 5

## 2023-12-04 MED ORDER — ONDANSETRON HCL 4 MG/2ML IJ SOLN
INTRAMUSCULAR | Status: DC | PRN
  Administered 2023-12-04: 4 mg via INTRAVENOUS

## 2023-12-04 MED ORDER — FENTANYL CITRATE (PF) 250 MCG/5ML IJ SOLN
INTRAMUSCULAR | Status: DC | PRN
  Administered 2023-12-04 (×4): 50 ug via INTRAVENOUS

## 2023-12-04 MED ORDER — EPHEDRINE SULFATE (PRESSORS) 50 MG/ML IJ SOLN
INTRAMUSCULAR | Status: DC | PRN
  Administered 2023-12-04: 10 mg via INTRAVENOUS

## 2023-12-04 MED ORDER — ONDANSETRON HCL 4 MG/2ML IJ SOLN
INTRAMUSCULAR | Status: AC
  Filled 2023-12-04: qty 2

## 2023-12-04 MED ORDER — PROPOFOL 10 MG/ML IV BOLUS
INTRAVENOUS | Status: AC
  Filled 2023-12-04: qty 20

## 2023-12-04 MED ORDER — IOHEXOL 300 MG/ML  SOLN
INTRAMUSCULAR | Status: DC | PRN
  Administered 2023-12-04: 100 mL

## 2023-12-04 MED ORDER — ROCURONIUM BROMIDE 10 MG/ML (PF) SYRINGE
PREFILLED_SYRINGE | INTRAVENOUS | Status: DC | PRN
  Administered 2023-12-04: 10 mg via INTRAVENOUS
  Administered 2023-12-04: 50 mg via INTRAVENOUS

## 2023-12-04 MED ORDER — DEXAMETHASONE SODIUM PHOSPHATE 10 MG/ML IJ SOLN
INTRAMUSCULAR | Status: AC
  Filled 2023-12-04: qty 1

## 2023-12-04 MED ORDER — 0.9 % SODIUM CHLORIDE (POUR BTL) OPTIME
TOPICAL | Status: DC | PRN
  Administered 2023-12-04: 1000 mL

## 2023-12-04 MED ORDER — PROPOFOL 10 MG/ML IV BOLUS
INTRAVENOUS | Status: DC | PRN
  Administered 2023-12-04: 150 mg via INTRAVENOUS

## 2023-12-04 MED ORDER — MUPIROCIN 2 % EX OINT
TOPICAL_OINTMENT | Freq: Two times a day (BID) | CUTANEOUS | Status: DC
  Filled 2023-12-04: qty 22

## 2023-12-04 MED ORDER — CHLORHEXIDINE GLUCONATE CLOTH 2 % EX PADS
6.0000 | MEDICATED_PAD | Freq: Every day | CUTANEOUS | Status: DC
  Administered 2023-12-04 – 2023-12-05 (×2): 6 via TOPICAL

## 2023-12-04 MED ORDER — DEXMEDETOMIDINE HCL IN NACL 80 MCG/20ML IV SOLN
INTRAVENOUS | Status: DC | PRN
  Administered 2023-12-04: 12 ug via INTRAVENOUS
  Administered 2023-12-04: 8 ug via INTRAVENOUS

## 2023-12-04 SURGICAL SUPPLY — 16 items
BAG URO CATCHER STRL LF (MISCELLANEOUS) ×1 IMPLANT
BRUSH URET BIOPSY 3F (UROLOGICAL SUPPLIES) IMPLANT
DRAPE FOOT SWITCH (DRAPES) ×1 IMPLANT
ELECT REM PT RETURN 15FT ADLT (MISCELLANEOUS) ×1 IMPLANT
GLOVE BIO SURGEON STRL SZ8 (GLOVE) ×1 IMPLANT
GOWN STRL REUS W/ TWL XL LVL3 (GOWN DISPOSABLE) ×1 IMPLANT
GUIDEWIRE STR DUAL SENSOR (WIRE) IMPLANT
KIT TURNOVER KIT A (KITS) ×1 IMPLANT
LOOP CUT BIPOLAR 24F LRG (ELECTROSURGICAL) IMPLANT
MANIFOLD NEPTUNE II (INSTRUMENTS) ×1 IMPLANT
NDL SAFETY ECLIPSE 18X1.5 (NEEDLE) ×1 IMPLANT
PACK CYSTO (CUSTOM PROCEDURE TRAY) ×1 IMPLANT
SYR 30ML LL (SYRINGE) IMPLANT
SYRINGE TOOMEY IRRIG 70ML (MISCELLANEOUS) IMPLANT
TUBING CONNECTING 10 (TUBING) ×1 IMPLANT
TUBING UROLOGY SET (TUBING) ×1 IMPLANT

## 2023-12-04 NOTE — Anesthesia Postprocedure Evaluation (Signed)
 Anesthesia Post Note  Patient: Roberto [name]  Procedure(s) Performed: TURBT (TRANSURETHRAL RESECTION OF BLADDER TUMOR)/LEFT RETROGRADE PYELOGRAM/CYSTOGRAMLLEFT UREEROSCOPY     Patient location during evaluation: PACU Anesthesia Type: General Level of consciousness: awake and alert Pain management: pain level controlled Vital Signs Assessment: post-procedure vital signs reviewed and stable Respiratory status: spontaneous breathing, nonlabored ventilation, respiratory function stable and patient connected to nasal cannula oxygen Cardiovascular status: blood pressure returned to baseline and stable Postop Assessment: no apparent nausea or vomiting Anesthetic complications: no   No notable events documented.  Last Vitals:  Vitals:   12/04/23 1750 12/04/23 1753  BP:  (!) 162/82  Pulse:  87  Resp:  11  Temp:    SpO2: 94% 94%    Last Pain:  Vitals:   12/04/23 1753  TempSrc:   PainSc: 0-No pain                 [name]

## 2023-12-04 NOTE — Transfer of Care (Signed)
 Immediate Anesthesia Transfer of Care Note  Patient: Roberto [name]  Procedure(s) Performed: TURBT (TRANSURETHRAL RESECTION OF BLADDER TUMOR)/LEFT RETROGRADE PYELOGRAM/CYSTOGRAMLLEFT UREEROSCOPY  Patient Location: PACU  Anesthesia Type:General  Level of Consciousness: awake, alert , and oriented  Airway & Oxygen Therapy: Patient Spontanous Breathing  Post-op Assessment: Report given to RN and Post -op Vital signs reviewed and stable  Post vital signs: Reviewed and stable  Last Vitals:  Vitals Value Taken Time  BP 146/80 12/04/23 17:21  Temp    Pulse 89 12/04/23 17:25  Resp 15 12/04/23 17:25  SpO2 100 % 12/04/23 17:25  Vitals shown include unfiled device data.  Last Pain:  Vitals:   12/04/23 1347  TempSrc: Oral  PainSc:       Patients Stated Pain Goal: 2 (12/04/23 0830)  Complications: No notable events documented.

## 2023-12-04 NOTE — Anesthesia Preprocedure Evaluation (Signed)
 Anesthesia Evaluation  Patient identified by MRN, date of birth, ID band Patient awake    Reviewed: Allergy & Precautions, H&P , NPO status , Patient's Chart, lab work & pertinent test results  Airway Mallampati: II  TM Distance: >3 FB Neck ROM: Full    Dental no notable dental hx. (+) Teeth Intact, Dental Advisory Given   Pulmonary asthma , COPD,  COPD inhaler, former smoker   Pulmonary exam normal breath sounds clear to auscultation       Cardiovascular negative cardio ROS  Rhythm:Regular Rate:Normal     Neuro/Psych negative neurological ROS  negative psych ROS   GI/Hepatic negative GI ROS, Neg liver ROS,,,  Endo/Other  negative endocrine ROS    Renal/GU negative Renal ROS  negative genitourinary   Musculoskeletal   Abdominal   Peds  Hematology  (+) Blood dyscrasia, anemia   Anesthesia Other Findings   Reproductive/Obstetrics negative OB ROS                              Anesthesia Physical Anesthesia Plan  ASA: 2  Anesthesia Plan: General   Post-op Pain Management: Ofirmev  IV (intra-op)*   Induction: Intravenous  PONV Risk Score and Plan: 3 and Ondansetron , Dexamethasone  and Treatment may vary due to age or medical condition  Airway Management Planned: Oral ETT  Additional Equipment:   Intra-op Plan:   Post-operative Plan: Extubation in OR  Informed Consent: I have reviewed the patients History and Physical, chart, labs and discussed the procedure including the risks, benefits and alternatives for the proposed anesthesia with the patient or authorized representative who has indicated his/her understanding and acceptance.     Dental advisory given  Plan Discussed with: CRNA  Anesthesia Plan Comments:         Anesthesia Quick Evaluation

## 2023-12-04 NOTE — Progress Notes (Signed)
 PROGRESS NOTE    Roberto [name]  FMW:[id number] DOB: Oct 02, 1950 DOA: 12/01/2023 PCP: Delayne Artist PARAS, MD   Chief Complaint  Patient presents with   Urinary Retention    Brief Narrative: Patient pleasant 73 year old gentleman history of allergic conjunctivitis, allergic rhinitis, asthma, COPD, history of tobacco use, hyperlipidemia hypertension presented to the ED with urinary retention after a week of gross hematuria.  Patient seen in consultation by urology and patient placed on continuous bladder irrigation.  Patient with continuous gross hematuria and patient for TURBT on 12/04/2023 per urology.   Assessment & Plan:   Principal Problem:   Gross hematuria Active Problems:   Chronic obstructive pulmonary disease (HCC)   Mixed hyperlipidemia   Primary hypertension   ABLA (acute blood loss anemia)   Bladder spasms  #1 gross hematuria  - Patient noted to have presenting urinary retention, gross hematuria. - CT renal stone protocol done with irregular hypodensity in the posterior bladder which could represent urothelial malignancy or clot.  Nonobstructing left nephrolithiasis versus vascular calcification. - CT hematuria workup done with ill-defined subcu enhancing mass within the left posterior bladder measuring 3.1 x 2.0 cm suspicious for primary bladder neoplasm with likely extension into the distal left ureter.  Lobulated filling defects within the bladder suspicious for additional bladder neoplasms.  No suspicious intraluminal filling defects within the right ureter noting a short segment of mid right ureter is not well-opacified.  No definite filling defects within the left renal pelvis noted in the lower pole pelvis/calyx appears mildly prominent.  Nonobstructing 4 mm stone in the left lower pole. -Patient seen and consult by urology and placed on CBI. - Patient with ongoing gross hematuria and patient for TURBT today, 12/04/2023, per urology for further evaluation. - Hemoglobin  currently stable at 13.1. - Follow.  2.  Acute blood loss anemia -Secondary to problem #1. -Hemoglobin currently stable at 13.1. -Repeat H&H this afternoon. - Follow H&H. - Transfusion threshold hemoglobin < 8.  3.  Bladder spasms -Continue scheduled oxybutynin . - Hyoscyamine  as needed.  4.  COPD -Stable. - Continue Brovana  twice daily, Pulmicort  twice daily. - Albuterol  nebs as needed.  5.  Hyperlipidemia -Continue statin.  6.  Hypertension - Norvasc  5 mg daily.    DVT prophylaxis: SCDs Code Status: Full Family Communication: Updated patient.  No family at bedside. Disposition: Likely home when clinically improved and cleared by urology.  Status is: Inpatient The patient will require care spanning > 2 midnights and should be moved to inpatient because: Severity of illness   Consultants:  Urology: Dr. Shane 12/02/2023  Procedures:  CT renal stone protocol 12/02/2023 Chest x-ray 12/02/2023 CT hematuria workup 12/02/2023  Antimicrobials:  Anti-infectives (From admission, onward)    None         Subjective: Patient sitting up in bed.  Denies any chest pain or shortness of breath.  No abdominal pain.  Complaining of bladder spasms.  Awaiting to go to the OR this morning.  Foley catheter with gross hematuria.  Objective: Vitals:   12/03/23 1133 12/03/23 1229 12/03/23 2001 12/04/23 0255  BP:  138/77 (!) 150/70 (!) 148/80  Pulse:  (!) 110 75 92  Resp:  18 17 18   Temp:  98.6 F (37 C) 97.8 F (36.6 C) 97.9 F (36.6 C)  TempSrc:  Oral Oral Oral  SpO2: 92% 95% 96% 98%  Weight:      Height:        Intake/Output Summary (Last 24 hours) at 12/04/2023 0816 Last  data filed at 12/04/2023 0700 Gross per 24 hour  Intake 87999 ml  Output 23250 ml  Net -11250 ml   Filed Weights   12/01/23 1801  Weight: 96.2 kg    Examination:  General exam: NAD. Respiratory system: Lungs clear to auscultation bilaterally.  No wheezes, no crackles, no rhonchi.  Fair air  movement.  Speaking in full sentences.   Cardiovascular system: Regular rate rhythm no murmurs rubs or gallops.  No JVD.  No pitting lower extremity edema.  Gastrointestinal system: Abdomen is soft, nontender, nondistended, positive bowel sounds.  No rebound.  No guarding.  Central nervous system: Alert and oriented. No focal neurological deficits. Extremities: Symmetric 5 x 5 power. Skin: No rashes, lesions or ulcers Psychiatry: Judgement and insight appear normal. Mood & affect appropriate.     Data Reviewed: I have personally reviewed following labs and imaging studies  CBC: Recent Labs  Lab 12/01/23 1806 12/02/23 1224 12/02/23 1643 12/03/23 1225 12/04/23 0711  WBC 7.9 10.9*  --  11.0* 9.4  NEUTROABS 5.5  --   --  8.7* 7.2  HGB 13.5 12.6* 12.3* 12.5* 13.1  HCT 40.8 36.4* 37.1* 37.4* 39.6  MCV 95.8 94.8  --  96.6 97.5  PLT 244 218  --  256 214    Basic Metabolic Panel: Recent Labs  Lab 12/01/23 1806 12/02/23 1224 12/03/23 1225 12/04/23 0711  NA 140 136 137 137  K 4.3 3.7 3.8 3.8  CL 102 102 102 102  CO2 26 24 25 24   GLUCOSE 110* 126* 140* 119*  BUN 21 14 16 15   CREATININE 0.88 0.86 0.77 0.77  CALCIUM  10.0 8.9 8.6* 9.1  MG  --   --  2.2  --     GFR: Estimated Creatinine Clearance: 95.9 mL/min (by C-G formula based on SCr of 0.77 mg/dL).  Liver Function Tests: Recent Labs  Lab 12/01/23 1806  AST 23  ALT 26  ALKPHOS 75  BILITOT 0.6  PROT 8.1  ALBUMIN 4.5    CBG: No results for input(s): GLUCAP in the last 168 hours.   No results found for this or any previous visit (from the past 240 hours).       Radiology Studies: DG Chest Port 1 View Result Date: 12/02/2023 CLINICAL DATA:  Asthma.  Preop cardiovascular exam. EXAM: PORTABLE CHEST 1 VIEW COMPARISON:  December 28, 2013. FINDINGS: The heart size and mediastinal contours are within normal limits. Minimal bibasilar subsegmental atelectasis or scarring is noted. The visualized skeletal structures  are unremarkable. IMPRESSION: Minimal bibasilar subsegmental atelectasis or scarring. Electronically Signed   By: Lynwood Landy Raddle M.D.   On: 12/02/2023 12:58   CT HEMATURIA WORKUP Result Date: 12/02/2023 CLINICAL DATA:  Urinary retention and hematuria with clots. Earlier same day CT revealed potential bladder tumors. Hematuria protocol for evaluation of upper tract disease. EXAM: CT ABDOMEN AND PELVIS WITHOUT AND WITH CONTRAST TECHNIQUE: Multidetector CT imaging of the abdomen and pelvis was performed following the standard protocol before and following the bolus administration of intravenous contrast. RADIATION DOSE REDUCTION: This exam was performed according to the departmental dose-optimization program which includes automated exposure control, adjustment of the mA and/or kV according to patient size and/or use of iterative reconstruction technique. CONTRAST:  OMNIPAQUE  IOHEXOL  300 MG/ML  SOLN COMPARISON:  Earlier same day CT abdomen and pelvis FINDINGS: Lower chest: Bibasilar linear atelectasis/scarring. Partially imaged subpleural right lower lobe nodule measures 5 mm (7:1). No pleural effusion or pneumothorax demonstrated. Partially imaged heart size  is normal. Hepatobiliary: Subcentimeter segment 2 hypodensity, too small to characterize. No intra or extrahepatic biliary ductal dilation. Normal gallbladder. Pancreas: No focal lesions or main ductal dilation. Spleen: Normal in size without focal abnormality. Adrenals/Urinary Tract: No adrenal nodules. 4 mm nonobstructing stone in the left lower pole. No hydronephrosis. Right interpolar 1.4 cm simple/minimally complicated cyst. No specific follow-up imaging recommended. Additional subcentimeter hypodensities, too small to characterize but also likely cysts. Prominent left lower pole pelvis/calyx (19:51). Urinary catheter in-situ. Ill-defined, subtly enhancing mass within the left posterior bladder measures 3.1 x 2.0 cm (13:71). On the delayed phase  images, there are additional lobulated filling defects. In the midline posterior bladder the filling defect measures 3.4 x 2.2 cm (23:71). No suspicious intraluminal filling defects within the right ureter, noting a short-segment of the mid right ureter is not well opacified. The proximal and distal left ureter and ureterovesical junction are not well opacified with suspicion for bladder mass extension into the distal left ureter (23:71). Stomach/Bowel: Gastric fundal diverticulum. No evidence of bowel wall thickening, distention, or inflammatory changes. Colonic diverticulosis without acute diverticulitis. Normal appendix. Vascular/Lymphatic: Aortic atherosclerosis. No enlarged abdominal or pelvic lymph nodes. Reproductive: Mildly enlarged prostate gland. Other: No free fluid, fluid collection, or free air. Musculoskeletal: Unchanged round sclerotic focus within the right iliac (3:47). Multilevel degenerative changes of the partially imaged thoracic and lumbar spine. IMPRESSION: 1. Ill-defined, subtly enhancing mass within the left posterior bladder measures 3.1 x 2.0 cm, suspicious for primary bladder neoplasm, with likely extension into the distal left ureter. 2. Additional lobulated filling defects within the bladder on the delayed phase images, suspicious for additional bladder neoplasms. 3. No suspicious intraluminal filling defects within the right ureter, noting a short-segment of the mid right ureter is not well opacified. No definite filling defects within the left renal pelvis, noting the lower pole pelvis/calyx appears mildly prominent. 4. No enlarged abdominal or pelvic lymph nodes. 5. Partially imaged subpleural right lower lobe nodule measures 5 mm, indeterminate. 6. Nonobstructing 4 mm stone in the left lower pole. 7.  Aortic Atherosclerosis (ICD10-I70.0). Electronically Signed   By: Limin  Xu M.D.   On: 12/02/2023 09:27        Scheduled Meds:  amLODipine   5 mg Oral Daily   arformoterol   15  mcg Nebulization BID   budesonide  (PULMICORT ) nebulizer solution  0.25 mg Nebulization BID   lidocaine   1 Application Urethral Once   montelukast   10 mg Oral QHS   oxybutynin   5 mg Oral TID   pantoprazole   40 mg Oral Q0600   polyethylene glycol  17 g Oral Daily   rosuvastatin   20 mg Oral QHS   senna-docusate  1 tablet Oral BID   Continuous Infusions:  sodium chloride  irrigation 0 mL (12/02/23 0559)     LOS: 1 day    Time spent: 40 minutes    Toribio Hummer, MD [facility]   To contact the attending provider between 7A-7P or the covering provider during after hours 7P-7A, please log into the web site [email or web address] and access using universal [place] password for that web site. If you do not have the password, please call the hospital operator.  12/04/2023, 8:16 AM

## 2023-12-04 NOTE — Anesthesia Procedure Notes (Signed)
 Procedure Name: Intubation Date/Time: 12/04/2023 3:55 PM  Performed by: Cena Epps, CRNAPre-anesthesia Checklist: Patient identified, Emergency Drugs available, Suction available and Patient being monitored Patient Re-evaluated:Patient Re-evaluated prior to induction Oxygen Delivery Method: Circle System Utilized Preoxygenation: Pre-oxygenation with 100% oxygen Induction Type: IV induction Ventilation: Mask ventilation without difficulty Laryngoscope Size: Mac and 4 Grade View: Grade I Tube type: Oral Tube size: 7.5 mm Number of attempts: 1 Airway Equipment and Method: Stylet and Oral airway Placement Confirmation: ETT inserted through vocal cords under direct vision, positive ETCO2 and breath sounds checked- equal and bilateral Secured at: 23 cm Tube secured with: Tape Dental Injury: Teeth and Oropharynx as per pre-operative assessment

## 2023-12-04 NOTE — Op Note (Signed)
 Operative Note  Preoperative diagnosis:  1..  Gross hematuria  Postoperative diagnosis: 1.   bladder tumor 4 cm okay 2   small contained bladder from previous Foley  Procedure(s): 1.  Large TURBT 2.  Cystogram 3.  Left retrograde pyelogram 4.  Left ureteroscopy. 5. Urethral dilation.  Sounds.  Surgeon: Shane Sensing MD  Assistants:  None  Anesthesia:  General  Complications:  None  EBL: 20  Specimens: 1.  Bladder tumor 2.  Left ureter cytology  Drains/Catheters: 1.  20 French ureteral catheters removed.  Intraoperative findings:   4 cm bladder tumor at its points point: Slightly smaller still 3 cm.  Located just lateral to the left ureteral orifice. Significant residual clot burden more than antibiotic Resection.  Of the muscle. Small catheter injury noted upon entering the bladder this is at the anterior bladder. Cystoscopy post resection demonstrates no perforation and bladder injury from catheter contained. Retrograde pyelogram demonstrates some narrowing of the distal ureter No tumors noted within the ureter on ureteroscopy cytology taken. Patient hemostatic at the end of the case.   Indication:  Roberto [name] is a 73 y.o. male with gross hematuria concern for possible bladder mass.  Unimpressive exam all the risks, benefits were discussed with the patient to include but not limited to infection, pain, bleeding, damage to adjacent structures, need for further operations, adverse reaction to anesthesia and death.  Patient understands these risks and agrees to proceed with the operation as planned.    Description of procedure: After informed consent was obtained from the patient, the patient was taken to the operating room. General anesthesia was administered. The patient was placed in dorsal lithotomy position and prepped and draped in usual sterile fashion. Sequential compression devices were applied to lower extremities at the beginning of the case for DVT  prophylaxis. Antibiotics were infused prior to surgery start time. A surgical time-out was performed to properly identify the patient, the surgery to be performed, and the surgical site.    Due to previous issues with placing catheter.  The meatus was dilated to 22 [place] using [employer] sequentially starting from 24 French.  This was done with the 26 French resectoscope was passed in the bladder on entering the bladder there was significant clot burden.  This was evacuated out.  After multiple attempts evacuate all the clot majority was removed but there was some adherent to the tumor bed on the left side just lateral to the ureteral orifice.  Specks of the bladder demonstrated there was only 1 tumor about 4 cm greater diameter in the left lateral ureter wall just lateral to the ureter.  Upon visualizing the dome of the bladder there was an area that appeared to be an old bladder injury from previous catheter placement it appeared to be containing the edges were cauterized the base was not.  After this was done the area appeared to be hemostatic.  It was then turned to the left bladder wall tumor.  The ureteral orifice was identified and was below the tumor.  Lateral edge of the tumor began to be resected.  The resection was carried from the lateral edge to the medial edge.  Taking care to stay away from the ureteral orifice.  Once the resection was completed the base was resected and then cauterized the edges were cauterized.  Until the ureter was hemostatic.  All the chips were evacuated and sent for pathology.  The patient appeared to be hemostatic.  Attention was then  turned to the left ureter where a sensor wire was advanced into the left ureter and then a 5 French ureteral catheter was placed into the ureter and retrograde pyelogram was performed there were no filling defects within the kidney.  There did not appear to be some narrowing at the distal aspect the ureter.  Leaving the wire in place  the cystoscope was removed and then a semirigid ureteroscope 4.5 [place] was advanced into the bladder and ureteroscopy was performed there was no overt trauma within the bladder some cytology was taken there is no need for a ureteral stent.  Once the semirigid ureteroscope was was removed rigid cystoscope was reintroduced into the bladder pan cystoscopy was performed no other tumors or abnormalities were noted in the bladder.  A 20 [place] Roush catheter was placed a cystogram was performed demonstrated no extravasation of the bladder and a contained anterior bladder injury..  The patient was placed to CBI.  He was then extubated Opyd to the PACU in stable condition.   Disposition: Patient can have regular diet continue CBI will clamp the morning likely discharge tomorrow.    Roberto [name]  [facility]

## 2023-12-04 NOTE — Progress Notes (Signed)
 Subjective: Doing well, no infectious symptoms overnight  Objective: Vital signs in last 24 hours: Temp:  [97.8 F (36.6 C)-98.6 F (37 C)] 97.9 F (36.6 C) (07/17 0255) Pulse Rate:  [75-110] 92 (07/17 0255) Resp:  [17-18] 18 (07/17 0255) BP: (138-150)/(70-80) 148/80 (07/17 0255) SpO2:  [92 %-98 %] 98 % (07/17 0255)  Intake/Output from previous day: 07/16 0701 - 07/17 0700 In: 87999  Out: 21550 [Urine:21550] Intake/Output this shift: Total I/O In: -  Out: 13150 [Urine:13150]  Physical Exam:  General: Alert and oriented CV: RRR Lungs: Clear Abdomen: Soft, ND, non tender  GU:  way foley in place draining light pink urine   Lab Results: Recent Labs    12/02/23 1224 12/02/23 1643 12/03/23 1225  HGB 12.6* 12.3* 12.5*  HCT 36.4* 37.1* 37.4*   BMET Recent Labs    12/02/23 1224 12/03/23 1225  NA 136 137  K 3.7 3.8  CL 102 102  CO2 24 25  GLUCOSE 126* 140*  BUN 14 16  CREATININE 0.86 0.77  CALCIUM  8.9 8.6*     Studies/Results: DG Chest Port 1 View Result Date: 12/02/2023 CLINICAL DATA:  Asthma.  Preop cardiovascular exam. EXAM: PORTABLE CHEST 1 VIEW COMPARISON:  December 28, 2013. FINDINGS: The heart size and mediastinal contours are within normal limits. Minimal bibasilar subsegmental atelectasis or scarring is noted. The visualized skeletal structures are unremarkable. IMPRESSION: Minimal bibasilar subsegmental atelectasis or scarring. Electronically Signed   By: Lynwood Landy Raddle M.D.   On: 12/02/2023 12:58   CT HEMATURIA WORKUP Result Date: 12/02/2023 CLINICAL DATA:  Urinary retention and hematuria with clots. Earlier same day CT revealed potential bladder tumors. Hematuria protocol for evaluation of upper tract disease. EXAM: CT ABDOMEN AND PELVIS WITHOUT AND WITH CONTRAST TECHNIQUE: Multidetector CT imaging of the abdomen and pelvis was performed following the standard protocol before and following the bolus administration of intravenous contrast. RADIATION  DOSE REDUCTION: This exam was performed according to the departmental dose-optimization program which includes automated exposure control, adjustment of the mA and/or kV according to patient size and/or use of iterative reconstruction technique. CONTRAST:  OMNIPAQUE  IOHEXOL  300 MG/ML  SOLN COMPARISON:  Earlier same day CT abdomen and pelvis FINDINGS: Lower chest: Bibasilar linear atelectasis/scarring. Partially imaged subpleural right lower lobe nodule measures 5 mm (7:1). No pleural effusion or pneumothorax demonstrated. Partially imaged heart size is normal. Hepatobiliary: Subcentimeter segment 2 hypodensity, too small to characterize. No intra or extrahepatic biliary ductal dilation. Normal gallbladder. Pancreas: No focal lesions or main ductal dilation. Spleen: Normal in size without focal abnormality. Adrenals/Urinary Tract: No adrenal nodules. 4 mm nonobstructing stone in the left lower pole. No hydronephrosis. Right interpolar 1.4 cm simple/minimally complicated cyst. No specific follow-up imaging recommended. Additional subcentimeter hypodensities, too small to characterize but also likely cysts. Prominent left lower pole pelvis/calyx (19:51). Urinary catheter in-situ. Ill-defined, subtly enhancing mass within the left posterior bladder measures 3.1 x 2.0 cm (13:71). On the delayed phase images, there are additional lobulated filling defects. In the midline posterior bladder the filling defect measures 3.4 x 2.2 cm (23:71). No suspicious intraluminal filling defects within the right ureter, noting a short-segment of the mid right ureter is not well opacified. The proximal and distal left ureter and ureterovesical junction are not well opacified with suspicion for bladder mass extension into the distal left ureter (23:71). Stomach/Bowel: Gastric fundal diverticulum. No evidence of bowel wall thickening, distention, or inflammatory changes. Colonic diverticulosis without acute diverticulitis. Normal  appendix. Vascular/Lymphatic: Aortic atherosclerosis. No  enlarged abdominal or pelvic lymph nodes. Reproductive: Mildly enlarged prostate gland. Other: No free fluid, fluid collection, or free air. Musculoskeletal: Unchanged round sclerotic focus within the right iliac (3:47). Multilevel degenerative changes of the partially imaged thoracic and lumbar spine. IMPRESSION: 1. Ill-defined, subtly enhancing mass within the left posterior bladder measures 3.1 x 2.0 cm, suspicious for primary bladder neoplasm, with likely extension into the distal left ureter. 2. Additional lobulated filling defects within the bladder on the delayed phase images, suspicious for additional bladder neoplasms. 3. No suspicious intraluminal filling defects within the right ureter, noting a short-segment of the mid right ureter is not well opacified. No definite filling defects within the left renal pelvis, noting the lower pole pelvis/calyx appears mildly prominent. 4. No enlarged abdominal or pelvic lymph nodes. 5. Partially imaged subpleural right lower lobe nodule measures 5 mm, indeterminate. 6. Nonobstructing 4 mm stone in the left lower pole. 7.  Aortic Atherosclerosis (ICD10-I70.0). Electronically Signed   By: Limin  Xu M.D.   On: 12/02/2023 09:27    Assessment/Plan: 73 year old male with gross hematuria suspect from bladder mass now draining. CTU concern for L posterior bladder mass.    # Gross hematuria: - continue CBI - Pt did not resolve GH yesterday will plan to take to OR today  - no current indication for transfusion  - transfuse as needed  - keep NPO   # Potential bladder cancer  - outpatient cystoscopy  - depending on course of GH may go to OR for evaluation during this hospitalization.   We discussed risks benefits and alternatives to cysto clot fulguration and TURBT. Mentioned the possibility of finding cancer need for long term catheter, risk of bladder perforation needing an open procedure. We dsicussed the  need for stent.  PT voiced his understanding and consent was obtained.   # Bladder spasm - schedule oxybutynin     LOS: 1 day   Jackey Pea MD 12/04/2023, 6:57 AM [facility]

## 2023-12-05 ENCOUNTER — Inpatient Hospital Stay (HOSPITAL_COMMUNITY)

## 2023-12-05 ENCOUNTER — Other Ambulatory Visit (HOSPITAL_COMMUNITY): Payer: Self-pay

## 2023-12-05 ENCOUNTER — Encounter (HOSPITAL_COMMUNITY): Payer: Self-pay | Admitting: Urology

## 2023-12-05 DIAGNOSIS — D494 Neoplasm of unspecified behavior of bladder: Secondary | ICD-10-CM

## 2023-12-05 LAB — BASIC METABOLIC PANEL WITH GFR
Anion gap: 10 (ref 5–15)
BUN: 19 mg/dL (ref 8–23)
CO2: 28 mmol/L (ref 22–32)
Calcium: 9.2 mg/dL (ref 8.9–10.3)
Chloride: 99 mmol/L (ref 98–111)
Creatinine, Ser: 1.07 mg/dL (ref 0.61–1.24)
GFR, Estimated: >60 mL/min (ref >60)
Glucose, Bld: 139 mg/dL — ABNORMAL HIGH (ref 70–99)
Potassium: 4.1 mmol/L (ref 3.5–5.1)
Sodium: 137 mmol/L (ref 135–145)

## 2023-12-05 LAB — CBC WITH DIFFERENTIAL/PLATELET
Abs Immature Granulocytes: 0.05 K/uL (ref 0.00–0.07)
Basophils Absolute: 0 K/uL (ref 0.0–0.1)
Basophils Relative: 0 %
Eosinophils Absolute: 0 K/uL (ref 0.0–0.5)
Eosinophils Relative: 0 %
HCT: 38.1 % — ABNORMAL LOW (ref 39.0–52.0)
Hemoglobin: 12.6 g/dL — ABNORMAL LOW (ref 13.0–17.0)
Immature Granulocytes: 0 %
Lymphocytes Relative: 5 %
Lymphs Abs: 0.6 K/uL — ABNORMAL LOW (ref 0.7–4.0)
MCH: 32.2 pg (ref 26.0–34.0)
MCHC: 33.1 g/dL (ref 30.0–36.0)
MCV: 97.4 fL (ref 80.0–100.0)
Monocytes Absolute: 1 K/uL (ref 0.1–1.0)
Monocytes Relative: 8 %
Neutro Abs: 10 K/uL — ABNORMAL HIGH (ref 1.7–7.7)
Neutrophils Relative %: 87 %
Platelets: 287 K/uL (ref 150–400)
RBC: 3.91 MIL/uL — ABNORMAL LOW (ref 4.22–5.81)
RDW: 12.5 % (ref 11.5–15.5)
WBC: 11.6 K/uL — ABNORMAL HIGH (ref 4.0–10.5)
nRBC: 0 % (ref 0.0–0.2)

## 2023-12-05 LAB — CREATININE, SERUM
Creatinine, Ser: 1.05 mg/dL (ref 0.61–1.24)
GFR, Estimated: >60 mL/min (ref >60)

## 2023-12-05 MED ORDER — AMLODIPINE BESYLATE 5 MG PO TABS
5.0000 mg | ORAL_TABLET | Freq: Every day | ORAL | 1 refills | Status: AC
  Filled 2023-12-05: qty 30, 30d supply, fill #0

## 2023-12-05 MED ORDER — TAMSULOSIN HCL 0.4 MG PO CAPS
0.4000 mg | ORAL_CAPSULE | Freq: Every day | ORAL | 1 refills | Status: DC
  Filled 2023-12-05: qty 30, 30d supply, fill #0

## 2023-12-05 MED ORDER — SODIUM CHLORIDE 0.9 % IV SOLN: INTRAVENOUS | Status: DC

## 2023-12-05 MED ORDER — KETOROLAC TROMETHAMINE 15 MG/ML IJ SOLN
15.0000 mg | Freq: Four times a day (QID) | INTRAMUSCULAR | Status: DC | PRN
  Administered 2023-12-05: 15 mg via INTRAVENOUS
  Filled 2023-12-05: qty 1

## 2023-12-05 MED ORDER — POLYETHYLENE GLYCOL 3350 17 GM/SCOOP PO POWD
17.0000 g | Freq: Two times a day (BID) | ORAL | 0 refills | Status: DC
  Filled 2023-12-05: qty 238, 7d supply, fill #0

## 2023-12-05 MED ORDER — TAMSULOSIN HCL 0.4 MG PO CAPS
0.4000 mg | ORAL_CAPSULE | Freq: Every day | ORAL | Status: DC
  Administered 2023-12-05: 0.4 mg via ORAL
  Filled 2023-12-05: qty 1

## 2023-12-05 MED ORDER — PANTOPRAZOLE SODIUM 40 MG PO TBEC
40.0000 mg | DELAYED_RELEASE_TABLET | Freq: Every day | ORAL | 0 refills | Status: DC
  Filled 2023-12-05: qty 30, 30d supply, fill #0

## 2023-12-05 MED ORDER — SODIUM CHLORIDE 0.9 % IV BOLUS
500.0000 mL | Freq: Once | INTRAVENOUS | Status: AC
  Administered 2023-12-05: 500 mL via INTRAVENOUS

## 2023-12-05 MED ORDER — SENNOSIDES-DOCUSATE SODIUM 8.6-50 MG PO TABS: 1.0000 | ORAL_TABLET | Freq: Two times a day (BID) | ORAL | Status: DC

## 2023-12-05 MED ORDER — POLYETHYLENE GLYCOL 3350 17 G PO PACK: 17.0000 g | PACK | Freq: Two times a day (BID) | ORAL | Status: DC

## 2023-12-05 MED ORDER — HYOSCYAMINE SULFATE 0.125 MG SL SUBL
0.1250 mg | SUBLINGUAL_TABLET | Freq: Four times a day (QID) | SUBLINGUAL | 0 refills | Status: DC | PRN
  Filled 2023-12-05: qty 20, 5d supply, fill #0

## 2023-12-05 MED ORDER — OXYCODONE HCL 5 MG PO TABS
5.0000 mg | ORAL_TABLET | ORAL | 0 refills | Status: DC | PRN
  Filled 2023-12-05: qty 15, 3d supply, fill #0

## 2023-12-05 MED ORDER — SORBITOL 70 % SOLN: 30.0000 mL | Status: AC

## 2023-12-05 MED ORDER — OXYBUTYNIN CHLORIDE 5 MG PO TABS
5.0000 mg | ORAL_TABLET | Freq: Three times a day (TID) | ORAL | 0 refills | Status: AC
  Filled 2023-12-05: qty 42, 14d supply, fill #0

## 2023-12-05 MED ORDER — BISACODYL 10 MG RE SUPP
10.0000 mg | Freq: Once | RECTAL | Status: AC
  Administered 2023-12-05: 10 mg via RECTAL
  Filled 2023-12-05: qty 1

## 2023-12-05 NOTE — Discharge Summary (Signed)
 Physician Discharge Summary  Roberto [name] FMW:[id number] DOB: [birth year] DOA: 12/01/2023  PCP: Delayne Artist PARAS, MD  Admit date: 12/01/2023 Discharge date: 12/05/2023  Time spent: 60 minutes  Recommendations for Outpatient Follow-up:  Follow-up with Delayne Artist PARAS, MD in 2 weeks.  On follow-up patient's blood pressure need to be assessed as patient started on Norvasc  during this hospitalization.  Patient will need a CBC done to follow-up on counts.  Patient will need a basic metabolic profile done to follow-up on electrolytes and renal function. Follow-up with urology, Dr. Shane as scheduled.   Discharge Diagnoses:  Principal Problem:   Gross hematuria Active Problems:   Bladder tumor   Chronic obstructive pulmonary disease (HCC)   Mixed hyperlipidemia   Primary hypertension   ABLA (acute blood loss anemia)   Bladder spasms   Discharge Condition: Stable and improved.  Diet recommendation: Regular  Filed Weights   12/01/23 1801  Weight: 96.2 kg    History of present illness:  HPI per Dr. Celinda Charlie Roberto [name] is a 73 y.o. male, [facility] with medical history significant of allergic conjunctivitis, allergic rhinitis, significant asthma, COPD, history of tobacco use in remission, mixed hyperlipidemia, hypertension who presented to the emergency department yesterday with urinary retention after having a week of hematuria with clots.  He was initially seen at urgent care and by with then sent to the emergency department.  He voided twice in the emergency department but his urine was bloody and painful.  He was seen by urology who put him on continuous bladder irrigation, which has improved the color of his urine but still urine red with some clots despite CBI. He denied fever, chills, rhinorrhea, sore throat, wheezing or hemoptysis.  No chest pain, palpitations, diaphoresis, PND, orthopnea or pitting edema of the lower extremities.  No abdominal pain, nausea, emesis, diarrhea,  constipation, melena or hematochezia.  No polyuria, polydipsia, polyphagia or blurred vision.    Lab work: Urinalysis with red and turbid with a few bacteria but unable to CBC was normal.  CMP was unremarkable with exception of a glucose of 110 mg/dL.   Imaging: CT renal study showing irregular hyperdensity in the posterior bladder that is incompletely evaluated without IV contrast.  This could represent urothelial malignancy or clot with cystoscopy recommended.  Nonobstructing left nephrolithiasis versus vascular calcification.  Aortic atherosclerosis.  CT hematuria workup showing ill-defined, subtly enhancing mass within the left posterior bladder measuring 3.1 x 2.0 cm, suspicious for primary bladder neoplasm, with likely extension into the distal left ureter.  Additional lobulated filling defects within the bladder on the delayed phase images, suspicious for additional bladder neoplasms.  No suspicious intraluminal filling defect within the right ureter, noting a short segment of the mid right ureter that is not well-opacified.  No definite filling defects within the left renal pelvis, noting in the left lower pole pelvis/calyx appears mildly prominent.  No enlarged abdominal or pelvic lymph nodes.  Partially imaged subpleural right lower lobe nodule measuring 5 mm, indeterminate.  Nonobstructing 4 mm stone in the left lower pole.  Aortic atherosclerosis.   ED course: Initial vital signs were temperature 98.2 F, pulse 84, respiration 16, BP 168/81 mmHg and O2 sat 96% on room air.  The patient received a tablet of hydrocodone /acetaminophen  5/325 mg p.o. x 1, hyoscyamine  125 mcg p.o. x 1, NS 1000 mL bolus.  And was prude on continuous bladder irrigation.  He was seen by urology who recommended admission to the medical service.  The  recommendation to start oxybutynin , continue CBI and monitor H&H.  Hospital Course:  #1 gross hematuria secondary to bladder tumor - Patient noted to have presented with  urinary retention, gross hematuria. - CT renal stone protocol done with irregular hypodensity in the posterior bladder which could represent urothelial malignancy or clot.  Nonobstructing left nephrolithiasis versus vascular calcification. - CT hematuria workup done with ill-defined subcu enhancing mass within the left posterior bladder measuring 3.1 x 2.0 cm suspicious for primary bladder neoplasm with likely extension into the distal left ureter.  Lobulated filling defects within the bladder suspicious for additional bladder neoplasms.  No suspicious intraluminal filling defects within the right ureter noting a short segment of mid right ureter is not well-opacified.  No definite filling defects within the left renal pelvis noted in the lower pole pelvis/calyx appears mildly prominent.  Nonobstructing 4 mm stone in the left lower pole. -Patient seen and consult by urology and placed on CBI. - Patient with ongoing gross hematuria and patient subsequently underwent TURBT on 12/04/2023 which showed a bladder tumor approximately 4 cm, small contained bladder from prior Foley.   - Patient placed on Flomax per urology who had recommended discharge with Foley catheter in place.  - On day of discharge patient had some complaints of left flank pain, renal ultrasound done was negative for hydronephrosis, patient given a laxative and had good results with resolution of left flank pain and it was felt by urology that patient's left flank pain was secondary to constipation.  - Patient will be discharged in stable and improved condition with Foley catheter in place and outpatient follow-up with urology as scheduled.  - On day of discharge patient noted to have a hemoglobin stable at 12.6.    2.  Acute blood loss anemia -Secondary to problem #1. -Hemoglobin stabilized at 12.6 by day of discharge.   - Outpatient follow-up with urology and PCP.    3.  Bladder spasms - Patient placed on scheduled oxybutynin  per  urology.   - Patient also placed on hyoscyamine  as needed.   -Patient was discharged on 1 to 2 weeks of scheduled oxybutynin  as well as hyoscyamine  as needed.   - Outpatient follow-up with urology as scheduled.   4.  COPD -Stable. - Patient maintained on Brovana  twice daily, Pulmicort  twice daily. - Albuterol  nebs as needed.   5.  Hyperlipidemia - Patient maintained on statin.   6.  Hypertension - Patient maintained on Norvasc  5 mg daily.  -Outpatient follow-up with PCP.  7.  Constipation -Postoperatively patient noted to have left flank pain and noted not to have had a bowel movement in 5 days. -Due to presentation with gross hematuria renal ultrasound ordered per urology which was negative for hydronephrosis or nephrolithiasis.  Bladder completely decompressed with urinary catheter in place. -Patient received laxatives with good results and resolution of left flank pain. -Patient cleared by urology for discharge. -Patient will be discharged on a bowel regimen.    Procedures: CT renal stone protocol 12/02/2023 Chest x-ray 12/02/2023 CT hematuria workup 12/02/2023 Renal ultrasound 12/05/2023 Large TURBT/cystogram/left retrograde pyelogram/left uteroscopy/urethral dilation.  12/04/2023 per Dr. Shane  Consultations: Urology: Dr. Shane 12/02/2023   Discharge Exam: Vitals:   12/05/23 1359 12/05/23 1544  BP: 138/74 124/67  Pulse:  (!) 101  Resp: 18 20  Temp: 97.6 F (36.4 C) 98.3 F (36.8 C)  SpO2: (!) 79% 95%    General: NAD Cardiovascular: RRR no murmurs rubs or gallops.  No JVD.  No lower  extremity edema. Respiratory: Clear to auscultation bilaterally.  No wheezes, no crackles, no rhonchi.  Fair air movement.  Speaking in full sentences.  Discharge Instructions   Discharge Instructions     Diet general   Complete by: As directed    Increase activity slowly   Complete by: As directed    No wound care   Complete by: As directed       Allergies as of  12/05/2023   No Known Allergies      Medication List     STOP taking these medications    sulfamethoxazole-trimethoprim 800-160 MG tablet Commonly known as: BACTRIM DS       TAKE these medications    ALBUTEROL  SULFATE HFA IN Inhale 1 puff into the lungs every 6 (six) hours as needed (wheezing / shortness of breath).   amLODipine  5 MG tablet Commonly known as: NORVASC  Take 1 tablet (5 mg total) by mouth daily. Start taking on: December 06, 2023   hyoscyamine  0.125 MG SL tablet Commonly known as: Oscimin  Place 1 tablet (0.125 mg total) under the tongue every 6 (six) hours as needed for cramping.   Mometasone  Furoate 200 MCG/ACT Aero Take 1 puff by mouth daily at 2 PM.   montelukast  10 MG tablet Commonly known as: SINGULAIR  Take 10 mg by mouth at bedtime.   multivitamin with minerals Tabs tablet Take 1 tablet by mouth daily.   Olodaterol HCl 2.5 MCG/ACT Aers Inhale 2 puffs into the lungs daily.   oxybutynin  5 MG tablet Commonly known as: DITROPAN  Take 1 tablet (5 mg total) by mouth 3 (three) times daily for 14 days.   oxyCODONE  5 MG immediate release tablet Commonly known as: Oxy IR/ROXICODONE  Take 1 tablet (5 mg total) by mouth every 4 (four) hours as needed for moderate pain (pain score 4-6) or breakthrough pain.   pantoprazole  40 MG tablet Commonly known as: PROTONIX  Take 1 tablet (40 mg total) by mouth daily at 6 (six) AM. Start taking on: December 06, 2023   polyethylene glycol powder 17 GM/SCOOP powder Commonly known as: GLYCOLAX /MIRALAX  Take 17 g by mouth 2 (two) times daily.   rosuvastatin  40 MG tablet Commonly known as: CRESTOR  Take 40 mg by mouth daily.   senna-docusate 8.6-50 MG tablet Commonly known as: Senokot-S Take 1 tablet by mouth 2 (two) times daily.   tamsulosin 0.4 MG Caps capsule Commonly known as: FLOMAX Take 1 capsule (0.4 mg total) by mouth daily. Start taking on: December 06, 2023       No Known Allergies  Follow-up Information      Madden, Evan J, MD. Schedule an appointment as soon as possible for a visit in 2 week(s).   Specialty: Family Medicine Contact information: [address] [place] [address] [phone]         Shane Steffan BROCKS, MD Follow up.   Specialty: Urology Why: Follow-up as scheduled Contact information: [address] [place] [address] [phone]                  The results of significant diagnostics from this hospitalization (including imaging, microbiology, ancillary and laboratory) are listed below for reference.    Significant Diagnostic Studies: US  RENAL Result Date: 12/05/2023 CLINICAL DATA:  [id number] Bladder tumor [id number], recent TURBT procedure EXAM: RENAL / URINARY TRACT ULTRASOUND COMPLETE COMPARISON:  December 02, 2023 FINDINGS: Right Kidney: Renal measurements: 11.5 x 6.1 x 6.2 cm = volume: 226.1 mL. Normal echogenicity. No mass. No hydronephrosis  or nephrolithiasis. Left Kidney: Renal measurements: 12.4 x 6.7 x 5.7 cm = volume: 244 mL. Normal echogenicity. No mass. No hydronephrosis or nephrolithiasis. Bladder: Completely decompressed and the bladder wall is not well visualized or evaluated. Urinary catheter retention balloon visualized. Other: None. IMPRESSION: 1. No hydronephrosis or nephrolithiasis. 2. The bladder is completely decompressed with a urinary catheter in place. Otherwise, the urinary bladder was not well evaluated. Electronically Signed   By: Rogelia Myers M.D.   On: 12/05/2023 11:10   DG C-Arm 1-60 Min-No Report Result Date: 12/04/2023 Fluoroscopy was utilized by the requesting physician.  No radiographic interpretation.   DG C-Arm 1-60 Min-No Report Result Date: 12/04/2023 Fluoroscopy was utilized by the requesting physician.  No radiographic interpretation.   DG Chest Port 1 View Result Date: 12/02/2023 CLINICAL DATA:  Asthma.  Preop cardiovascular exam. EXAM: PORTABLE CHEST 1 VIEW COMPARISON:  December 28, 2013. FINDINGS:  The heart size and mediastinal contours are within normal limits. Minimal bibasilar subsegmental atelectasis or scarring is noted. The visualized skeletal structures are unremarkable. IMPRESSION: Minimal bibasilar subsegmental atelectasis or scarring. Electronically Signed   By: Lynwood Landy Raddle M.D.   On: 12/02/2023 12:58   CT HEMATURIA WORKUP Result Date: 12/02/2023 CLINICAL DATA:  Urinary retention and hematuria with clots. Earlier same day CT revealed potential bladder tumors. Hematuria protocol for evaluation of upper tract disease. EXAM: CT ABDOMEN AND PELVIS WITHOUT AND WITH CONTRAST TECHNIQUE: Multidetector CT imaging of the abdomen and pelvis was performed following the standard protocol before and following the bolus administration of intravenous contrast. RADIATION DOSE REDUCTION: This exam was performed according to the departmental dose-optimization program which includes automated exposure control, adjustment of the mA and/or kV according to patient size and/or use of iterative reconstruction technique. CONTRAST:  OMNIPAQUE  IOHEXOL  300 MG/ML  SOLN COMPARISON:  Earlier same day CT abdomen and pelvis FINDINGS: Lower chest: Bibasilar linear atelectasis/scarring. Partially imaged subpleural right lower lobe nodule measures 5 mm (7:1). No pleural effusion or pneumothorax demonstrated. Partially imaged heart size is normal. Hepatobiliary: Subcentimeter segment 2 hypodensity, too small to characterize. No intra or extrahepatic biliary ductal dilation. Normal gallbladder. Pancreas: No focal lesions or main ductal dilation. Spleen: Normal in size without focal abnormality. Adrenals/Urinary Tract: No adrenal nodules. 4 mm nonobstructing stone in the left lower pole. No hydronephrosis. Right interpolar 1.4 cm simple/minimally complicated cyst. No specific follow-up imaging recommended. Additional subcentimeter hypodensities, too small to characterize but also likely cysts. Prominent left lower pole  pelvis/calyx (19:51). Urinary catheter in-situ. Ill-defined, subtly enhancing mass within the left posterior bladder measures 3.1 x 2.0 cm (13:71). On the delayed phase images, there are additional lobulated filling defects. In the midline posterior bladder the filling defect measures 3.4 x 2.2 cm (23:71). No suspicious intraluminal filling defects within the right ureter, noting a short-segment of the mid right ureter is not well opacified. The proximal and distal left ureter and ureterovesical junction are not well opacified with suspicion for bladder mass extension into the distal left ureter (23:71). Stomach/Bowel: Gastric fundal diverticulum. No evidence of bowel wall thickening, distention, or inflammatory changes. Colonic diverticulosis without acute diverticulitis. Normal appendix. Vascular/Lymphatic: Aortic atherosclerosis. No enlarged abdominal or pelvic lymph nodes. Reproductive: Mildly enlarged prostate gland. Other: No free fluid, fluid collection, or free air. Musculoskeletal: Unchanged round sclerotic focus within the right iliac (3:47). Multilevel degenerative changes of the partially imaged thoracic and lumbar spine. IMPRESSION: 1. Ill-defined, subtly enhancing mass within the left posterior bladder measures 3.1 x 2.0 cm, suspicious  for primary bladder neoplasm, with likely extension into the distal left ureter. 2. Additional lobulated filling defects within the bladder on the delayed phase images, suspicious for additional bladder neoplasms. 3. No suspicious intraluminal filling defects within the right ureter, noting a short-segment of the mid right ureter is not well opacified. No definite filling defects within the left renal pelvis, noting the lower pole pelvis/calyx appears mildly prominent. 4. No enlarged abdominal or pelvic lymph nodes. 5. Partially imaged subpleural right lower lobe nodule measures 5 mm, indeterminate. 6. Nonobstructing 4 mm stone in the left lower pole. 7.  Aortic  Atherosclerosis (ICD10-I70.0). Electronically Signed   By: Limin  Xu M.D.   On: 12/02/2023 09:27   CT Renal Stone Study Result Date: 12/02/2023 CLINICAL DATA:  Abdominal/flank pain, stone suspected, frank hematuria, urinary retention. EXAM: CT ABDOMEN AND PELVIS WITHOUT CONTRAST TECHNIQUE: Multidetector CT imaging of the abdomen and pelvis was performed following the standard protocol without IV contrast. RADIATION DOSE REDUCTION: This exam was performed according to the departmental dose-optimization program which includes automated exposure control, adjustment of the mA and/or kV according to patient size and/or use of iterative reconstruction technique. COMPARISON:  None Available. FINDINGS: Lower chest: No acute abnormality. Hepatobiliary: Unremarkable liver. Normal gallbladder. No biliary dilation. Pancreas: Unremarkable. Spleen: Unremarkable. Adrenals/Urinary Tract: Normal adrenal glands. Nonobstructing left nephrolithiasis versus vascular calcifications. No or hydronephrosis. Irregular hyperdensity in the posterior bladder is incompletely evaluated without IV contrast. This could represent urothelial malignancy or clot (circa series 2/image 68). There is a punctate calcification in the hyperdense mass. Stomach/Bowel: Normal caliber large and small bowel. No bowel wall thickening. The appendix is normal. Posterior gastric diverticulum. Vascular/Lymphatic: Aortic atherosclerosis. No enlarged abdominal or pelvic lymph nodes. Reproductive: Unremarkable. Other: No free intraperitoneal fluid or air. Musculoskeletal: No acute fracture. IMPRESSION: 1. Irregular hyperdensity in the posterior bladder is incompletely evaluated without IV contrast. This could represent urothelial malignancy or clot. Recommend cystoscopy. 2. Nonobstructing left nephrolithiasis versus vascular calcifications. 3. Aortic Atherosclerosis (ICD10-I70.0). Electronically Signed   By: Norman Gatlin M.D.   On: 12/02/2023 01:16     Microbiology: Recent Results (from the past 240 hours)  Surgical pcr screen     Status: None   Collection Time: 12/04/23  9:16 AM   Specimen: Nasal Mucosa; Nasal Swab  Result Value Ref Range Status   MRSA, PCR NEGATIVE NEGATIVE Final   Staphylococcus aureus NEGATIVE NEGATIVE Final    Comment: (NOTE) The Xpert SA Assay (FDA approved for NASAL specimens in patients 29 years of age and older), is one component of a comprehensive surveillance program. It is not intended to diagnose infection nor to guide or monitor treatment. Performed at [facility], [address], [place], [place] [address]      Labs: Basic Metabolic Panel: Recent Labs  Lab 12/01/23 1806 12/02/23 1224 12/03/23 1225 12/04/23 0711 12/05/23 0349 12/05/23 1247  NA 140 136 137 137 137  --   K 4.3 3.7 3.8 3.8 4.1  --   CL 102 102 102 102 99  --   CO2 26 24 25 24 28   --   GLUCOSE 110* 126* 140* 119* 139*  --   BUN 21 14 16 15 19   --   CREATININE 0.88 0.86 0.77 0.77 1.07 1.05  CALCIUM  10.0 8.9 8.6* 9.1 9.2  --   MG  --   --  2.2  --   --   --    Liver Function Tests: Recent Labs  Lab 12/01/23 1806  AST 23  ALT 26  ALKPHOS 75  BILITOT 0.6  PROT 8.1  ALBUMIN 4.5   No results for input(s): LIPASE, AMYLASE in the last 168 hours. No results for input(s): AMMONIA in the last 168 hours. CBC: Recent Labs  Lab 12/01/23 1806 12/02/23 1224 12/02/23 1643 12/03/23 1225 12/04/23 0711 12/04/23 1729 12/05/23 0349  WBC 7.9 10.9*  --  11.0* 9.4  --  11.6*  NEUTROABS 5.5  --   --  8.7* 7.2  --  10.0*  HGB 13.5 12.6* 12.3* 12.5* 13.1 12.2* 12.6*  HCT 40.8 36.4* 37.1* 37.4* 39.6 36.5* 38.1*  MCV 95.8 94.8  --  96.6 97.5  --  97.4  PLT 244 218  --  256 214  --  287   Cardiac Enzymes: No results for input(s): CKTOTAL, CKMB, CKMBINDEX, TROPONINI in the last 168 hours. BNP: BNP (last 3 results) No results for input(s): BNP in the last 8760 hours.  ProBNP (last 3  results) No results for input(s): PROBNP in the last 8760 hours.  CBG: No results for input(s): GLUCAP in the last 168 hours.     Signed:  Toribio Hummer MD.  [facility] 12/05/2023, 4:09 PM

## 2023-12-05 NOTE — Progress Notes (Signed)
 Patient declined dose of polyethylene glycol and sorbitol this morning due to being NPO for potential stent placement with Urology provider.  Agreeable to suppository for constipation this morning.

## 2023-12-05 NOTE — Progress Notes (Signed)
 Nursing Discharge Note   Name: Roberto [name] MRN: [id number] DOB: 10/03/1950    Admit Date:  12/01/2023  Discharge Date:  12/05/2023   Roberto [name] is to be discharged home per MD order.  AVS completed. Reviewed with patient and family at bedside. Highlighted copy provided for patient to take home.  Patient/caregiver able to verbalize understanding of discharge instructions. PIV removed. Patient stable upon discharge.   Foley care/ leg bag transition/ pericare taught by Charge Nurse Megan B RN at bedside.  TOC medications delivered to bedside by Patient Flow Nurse Kristeen RN.   Discharge Instructions   None      Discharge Instructions     Diet general   Complete by: As directed    Increase activity slowly   Complete by: As directed    No wound care   Complete by: As directed         Allergies as of 12/05/2023   No Known Allergies      Medication List     STOP taking these medications    sulfamethoxazole-trimethoprim 800-160 MG tablet Commonly known as: BACTRIM DS       TAKE these medications    ALBUTEROL  SULFATE HFA IN Inhale 1 puff into the lungs every 6 (six) hours as needed (wheezing / shortness of breath).   amLODipine  5 MG tablet Commonly known as: NORVASC  Take 1 tablet (5 mg total) by mouth daily. Start taking on: December 06, 2023   Mometasone  Furoate 200 MCG/ACT Aero Take 1 puff by mouth daily at 2 PM.   montelukast  10 MG tablet Commonly known as: SINGULAIR  Take 10 mg by mouth at bedtime.   multivitamin with minerals Tabs tablet Take 1 tablet by mouth daily.   Olodaterol HCl 2.5 MCG/ACT Aers Inhale 2 puffs into the lungs daily.   Oscimin  0.125 MG SL tablet Generic drug: hyoscyamine  Place 1 tablet (0.125 mg total) under the tongue every 6 (six) hours as needed for cramping.   oxybutynin  5 MG tablet Commonly known as: DITROPAN  Take 1 tablet (5 mg total) by mouth 3 (three) times daily for 14 days.   oxyCODONE  5 MG immediate  release tablet Commonly known as: Oxy IR/ROXICODONE  Take 1 tablet (5 mg total) by mouth every 4 (four) hours as needed for moderate pain (pain score 4-6) or breakthrough pain.   pantoprazole  40 MG tablet Commonly known as: PROTONIX  Take 1 tablet (40 mg total) by mouth daily at 6 (six) AM. Start taking on: December 06, 2023   polyethylene glycol powder 17 GM/SCOOP powder Commonly known as: GLYCOLAX /MIRALAX  Take 17 g by mouth 2 (two) times daily.   rosuvastatin  40 MG tablet Commonly known as: CRESTOR  Take 40 mg by mouth daily.   senna-docusate 8.6-50 MG tablet Commonly known as: Senokot-S Take 1 tablet by mouth 2 (two) times daily.   tamsulosin 0.4 MG Caps capsule Commonly known as: FLOMAX Take 1 capsule (0.4 mg total) by mouth daily. Start taking on: December 06, 2023         Discharge Instructions/ Education: An After Visit Summary was printed and given to the patient. Discharge instructions given to patient/family with verbalized understanding. Discharge education completed with patient/family including: follow up instructions, medication list, discharge activities, and limitations if indicated.  Additional discharge instructions as indicated by discharging provider also reviewed.  Patient and family able to verbalize understanding, all questions fully answered. Patient instructed to return to Emergency Department, call 911, or call MD for any changes in condition.  Patient escorted via wheelchair to lobby and discharged home via private automobile.

## 2023-12-05 NOTE — Progress Notes (Signed)
 TOC meds in a secure bag delivered to patient in room  by this RN

## 2023-12-05 NOTE — Progress Notes (Signed)
 Pain resolved with BM. US  shows no hydro. Left flank pain likely from constipation not from urologic cause. Pt can be discharged today.   F/u has been set up.

## 2023-12-05 NOTE — Progress Notes (Addendum)
 1 Day Post-Op Subjective: Having left flank pain, concern for ureteral spasms. NO N/V. NO F/C. Denies abdominal pain. HR 86 this AM. Has not had BM in 5 days.   Objective: Vital signs in last 24 hours: Temp:  [97.6 F (36.4 C)-97.9 F (36.6 C)] 97.9 F (36.6 C) (07/18 0426) Pulse Rate:  [60-102] 99 (07/18 0426) Resp:  [11-20] 19 (07/18 0426) BP: (142-187)/(67-98) 157/84 (07/18 0426) SpO2:  [94 %-100 %] 99 % (07/18 0426)  Intake/Output from previous day: 07/17 0701 - 07/18 0700 In: 89549 [I.V.:600; IV Piggyback:100] Out: 8905 [Urine:8900; Blood:5] Intake/Output this shift: No intake/output data recorded.  Physical Exam:  General: Alert and oriented CV: RRR Lungs: Clear Abdomen: Soft, ND, non tender  GU: urine clear off CBI  Lab Results: Recent Labs    12/03/23 1225 12/04/23 0711 12/04/23 1729  HGB 12.5* 13.1 12.2*  HCT 37.4* 39.6 36.5*   BMET Recent Labs    12/04/23 0711 12/05/23 0349  NA 137 137  K 3.8 4.1  CL 102 99  CO2 24 28  GLUCOSE 119* 139*  BUN 15 19  CREATININE 0.77 1.07  CALCIUM  9.1 9.2     Studies/Results: DG C-Arm 1-60 Min-No Report Result Date: 12/04/2023 Fluoroscopy was utilized by the requesting physician.  No radiographic interpretation.   DG C-Arm 1-60 Min-No Report Result Date: 12/04/2023 Fluoroscopy was utilized by the requesting physician.  No radiographic interpretation.    Assessment/Plan: 73 year old male with gross hematuria suspect from bladder mass now draining. S/p TURBT and L URS on 7/17.   # Gross hematuria: - s/p TURBT  - urine clear  - DC with catheter  - Void trial by [facility] set up   # Bladder cancer - s/p TURBT - Left large bladder tumor, - f/u urology clinc for pathology review   # Left flank pain - concen for L renal pain form ureteral spasm - Cr increased to 1.07 unclear if this dehydration or ureteral spasm - make NPO - if pain worsens will stent this afternoon  - give IVF  - repeat Cr  1300 - please give NSAIDs for flank pain (toradol)  - started flomax     LOS: 2 days   Jackey Pea MD 12/05/2023, 7:17 AM [facility]

## 2023-12-08 LAB — CYTOLOGY - NON PAP

## 2023-12-08 LAB — SURGICAL PATHOLOGY

## 2023-12-15 ENCOUNTER — Encounter: Payer: Self-pay | Admitting: Internal Medicine

## 2023-12-15 ENCOUNTER — Ambulatory Visit (INDEPENDENT_AMBULATORY_CARE_PROVIDER_SITE_OTHER): Admitting: Internal Medicine

## 2023-12-15 DIAGNOSIS — J302 Other seasonal allergic rhinitis: Secondary | ICD-10-CM

## 2023-12-15 DIAGNOSIS — J3089 Other allergic rhinitis: Secondary | ICD-10-CM | POA: Diagnosis not present

## 2023-12-15 NOTE — Progress Notes (Signed)
 Date of Service/Encounter:  12/15/23  Allergy  testing appointment   Previous visit on 09/08/23, seen for allergic rhionconjunctivitis, moderate persistent asthma.  Please see that note for additional details.  Today reports for allergy  diagnostic testing:    DIAGNOSTICS:  Skin Testing: Environmental allergy  panel. Adequate positive and negative controls. Results discussed with patient/family.  Airborne Adult Perc - 12/15/23 1010     Time Antigen Placed 1010    Allergen Manufacturer Jestine    Location Back    Number of Test 55    1. Control-Buffer 50% Glycerol Negative    2. Control-Histamine 4+    3. Bahia 4+    4. [place] 4+    5. Johnson 4+    6. [place]  Blue 4+    7. Meadow Fescue 4+    8. Perennial Rye 4+    9. Timothy 4+    10. Ragweed Mix 4+    12. Plantain,  English Negative    13. Baccharis Negative    14. Dog Fennel Negative    15. Russian Thistle Negative    16. Lamb's Quarters Negative    17. Sheep Sorrell Negative    18. Rough Pigweed Negative    19. Marsh Elder, Rough Negative    20. Mugwort, Common Negative    21. Box, Elder Negative    22. Cedar, red Negative    23. Sweet Gum Negative    24. Pecan Pollen Negative    25. Pine Mix Negative    26. Walnut, Black Pollen Negative    27. Red Mulberry Negative    28. Ash Mix Negative    29. Birch Mix Negative    30. Beech American Negative    31. Cottonwood, [place] Negative    32. Hickory, White Negative    33. Maple Mix Negative    34. Oak, [place] Mix Negative    35. Sycamore Eastern Negative    36. Alternaria Alternata Negative    37. Cladosporium Herbarum Negative    38. Aspergillus Mix Negative    39. Penicillium Mix Negative    40. Bipolaris Sorokiniana (Helminthosporium) Negative    41. Drechslera Spicifera (Curvularia) Negative    42. Mucor Plumbeus Negative    43. Fusarium Moniliforme Negative    44. Aureobasidium Pullulans (pullulara) Negative    45. Rhizopus Oryzae Negative    46.  Botrytis Cinera Negative    47. Epicoccum Nigrum Negative    48. Phoma Betae Negative    49. Dust Mite Mix Negative    50. Cat Hair 10,000 BAU/ml Negative    51.  Dog Epithelia Negative    52. Mixed Feathers Negative    53. Horse Epithelia Negative    54. Cockroach, German Negative    55. Tobacco Leaf Negative          Intradermal - 12/15/23 1034     Time Antigen Placed 1034    Allergen Manufacturer Jestine    Location Arm    Number of Test 11    Control Negative    Weed Mix 3+    Tree Mix 2+    Mold 1 Negative    Mold 2 Negative    Mold 3 Negative    Mold 4 Negative    Mite Mix Negative    Cat 3+    Dog Negative    Cockroach Negative          Allergy  testing results were read and interpreted by myself, documented by clinical staff.  Patient provided  with copy of allergy  testing along with avoidance measures when indicated.   Rocky Endow, MD  [facility]   Assessment and plan:    Environmental allergies:seasonal and perennial allergic rhinitis. 2024 bloodwork was positive to grass, trees, ragweed. Start environmental control measures as below. Continue Singulair  (montelukast ) 10mg  daily at night. Use Flonase (fluticasone) nasal spray 1-2 sprays per nostril once a day as needed for nasal congestion.  Nasal saline spray (i.e., Simply Saline) or nasal saline lavage (i.e., NeilMed) is recommended as needed and prior to medicated nasal sprays. -Consider allergy  injections to reduce lifetime symptoms and need for medications by teaching your immune system to become tolerant of the environmental allergens you are allergic to Skin testing 12/15/23: positive to grass pollen, ragweed pollen; intradermals positive to weed mix, cat and borderline to tree mix; allergen avoidance. -Consider allergy  injections to reduce lifetime symptoms and need for medications by teaching your immune system to become tolerant of the environmental allergens you are  allergic to   Breathing Keep follow up with pulmonology. Use medications as per pulmonary recommendations. May use albuterol  rescue inhaler 2 puffs every 4 to 6 hours as needed for shortness of breath, chest tightness, coughing, and wheezing. May use albuterol  rescue inhaler 2 puffs 5 to 15 minutes prior to strenuous physical activities. Monitor frequency of use - if you need to use it more than twice per week on a consistent basis let us  know.  Breathing control goals:  Full participation in all desired activities (may need albuterol  before activity) Albuterol  use two times or less a week on average (not counting use with activity) Cough interfering with sleep two times or less a month Oral steroids no more than once a year No hospitalizations   Follow up : 2 months, sooner if needed. If doing well, consider starting allergy  injections. (Traditional build-up) It was a pleasure seeing you again in clinic today! Thank you for allowing me to participate in your care.

## 2023-12-15 NOTE — Patient Instructions (Signed)
 Environmental allergies 2024 bloodwork was positive to grass, trees, ragweed. Start environmental control measures as below. Continue Singulair  (montelukast ) 10mg  daily at night. Use Flonase (fluticasone) nasal spray 1-2 sprays per nostril once a day as needed for nasal congestion.  Nasal saline spray (i.e., Simply Saline) or nasal saline lavage (i.e., NeilMed) is recommended as needed and prior to medicated nasal sprays. -Consider allergy  injections to reduce lifetime symptoms and need for medications by teaching your immune system to become tolerant of the environmental allergens you are allergic to Skin testing 12/15/23: positive to grass pollen, ragweed pollen; intradermals positive to weed mix, cat and borderline to tree mix; allergen avoidance. -Consider allergy  injections to reduce lifetime symptoms and need for medications by teaching your immune system to become tolerant of the environmental allergens you are allergic to   Breathing Keep follow up with pulmonology. Use medications as per pulmonary recommendations. May use albuterol  rescue inhaler 2 puffs every 4 to 6 hours as needed for shortness of breath, chest tightness, coughing, and wheezing. May use albuterol  rescue inhaler 2 puffs 5 to 15 minutes prior to strenuous physical activities. Monitor frequency of use - if you need to use it more than twice per week on a consistent basis let us  know.  Breathing control goals:  Full participation in all desired activities (may need albuterol  before activity) Albuterol  use two times or less a week on average (not counting use with activity) Cough interfering with sleep two times or less a month Oral steroids no more than once a year No hospitalizations   Follow up : 2 months, sooner if needed. If doing well, consider starting allergy  injections. (Traditional build-up) It was a pleasure seeing you again in clinic today! Thank you for allowing me to participate in your care.  Rocky Endow, MD [facility]    Reducing Pollen Exposure Pollen seasons: trees (spring), grass (summer) and ragweed/weeds (fall). Keep windows closed in your home and car to lower pollen exposure.  Install air conditioning in the bedroom and throughout the house if possible.  Avoid going out in dry windy days - especially early morning. Pollen counts are highest between 5 - 10 AM and on dry, hot and windy days.  Save outside activities for late afternoon or after a heavy rain, when pollen levels are lower.  Avoid mowing of grass if you have grass pollen allergy . Be aware that pollen can also be transported indoors on people and pets.  Dry your clothes in an automatic dryer rather than hanging them outside where they might collect pollen.  Rinse hair and eyes before bedtime.  Airborne Adult Perc - 12/15/23 1010     Time Antigen Placed 1010    Allergen Manufacturer Jestine    Location Back    Number of Test 55    1. Control-Buffer 50% Glycerol Negative    2. Control-Histamine 4+    3. [place]ia 4+    4. [place] 4+    5. Johnson 4+    6. [place]  Blue 4+    7. Meadow Fescue 4+    8. Perennial Rye 4+    9. Timothy 4+    10. Ragweed Mix 4+    12. Plantain,  English Negative    13. Baccharis Negative    14. Dog Fennel Negative    15. Russian Thistle Negative    16. Lamb's Quarters Negative    17. Sheep Sorrell Negative    18. Rough Pigweed Negative  19. Marsh Elder, Rough Negative    20. Mugwort, Common Negative    21. Box, Elder Negative    22. Cedar, red Negative    23. Sweet Gum Negative    24. Pecan Pollen Negative    25. Pine Mix Negative    26. Walnut, Black Pollen Negative    27. Red Mulberry Negative    28. Ash Mix Negative    29. Birch Mix Negative    30. Beech American Negative    31. Cottonwood, [place] Negative    32. Hickory, White Negative    33. Maple Mix Negative    34. Oak, [place] Mix Negative    35. Sycamore Eastern Negative    36.  Alternaria Alternata Negative    37. Cladosporium Herbarum Negative    38. Aspergillus Mix Negative    39. Penicillium Mix Negative    40. Bipolaris Sorokiniana (Helminthosporium) Negative    41. Drechslera Spicifera (Curvularia) Negative    42. Mucor Plumbeus Negative    43. Fusarium Moniliforme Negative    44. Aureobasidium Pullulans (pullulara) Negative    45. Rhizopus Oryzae Negative    46. Botrytis Cinera Negative    47. Epicoccum Nigrum Negative    48. Phoma Betae Negative    49. Dust Mite Mix Negative    50. Cat Hair 10,000 BAU/ml Negative    51.  Dog Epithelia Negative    52. Mixed Feathers Negative    53. Horse Epithelia Negative    54. Cockroach, German Negative    55. Tobacco Leaf Negative          Intradermal - 12/15/23 1034     Time Antigen Placed 1034    Allergen Manufacturer Jestine    Location Arm    Number of Test 11    Control Negative    Weed Mix 3+    Tree Mix 2+    Mold 1 Negative    Mold 2 Negative    Mold 3 Negative    Mold 4 Negative    Mite Mix Negative    Cat 3+    Dog Negative    Cockroach Negative

## 2023-12-31 ENCOUNTER — Other Ambulatory Visit (HOSPITAL_COMMUNITY): Payer: Self-pay

## 2024-01-23 ENCOUNTER — Other Ambulatory Visit: Payer: Self-pay | Admitting: Urology

## 2024-02-11 ENCOUNTER — Encounter (HOSPITAL_COMMUNITY): Payer: Self-pay | Admitting: Urology

## 2024-02-11 NOTE — Progress Notes (Signed)
 Spoke w/ via phone for pre-op interview--- Jaken Lab needs dos----  BMP (gent)       Lab results------ Current EKG in Epic dated 12/02/23. COVID test -----patient states asymptomatic no test needed Arrive at -------0900 NPO after MN NO Solid Food.  Clear liquids from MN until---0800 Pre-Surgery Ensure or G2:  Med rec completed Medications to take morning of surgery ----- Use all inhalers Diabetic medication -----  GLP1 agonist last dose: GLP1 instructions:  Patient instructed no nail polish to be worn day of surgery Patient instructed to bring photo id and insurance card day of surgery Patient aware to have Driver (ride ) / caregiver    for 24 hours after surgery - Wife Lindell Renfrew Patient Special Instructions ----- Pre-Op special Instructions -----  Patient verbalized understanding of instructions that were given at this phone interview. Patient denies chest pain, sob, fever, cough at the interview.

## 2024-02-16 ENCOUNTER — Other Ambulatory Visit: Payer: Self-pay

## 2024-02-16 ENCOUNTER — Ambulatory Visit: Admitting: Internal Medicine

## 2024-02-16 ENCOUNTER — Encounter: Payer: Self-pay | Admitting: Internal Medicine

## 2024-02-16 VITALS — BP 138/76 | HR 72 | Temp 97.9°F

## 2024-02-16 DIAGNOSIS — J302 Other seasonal allergic rhinitis: Secondary | ICD-10-CM

## 2024-02-16 DIAGNOSIS — J3089 Other allergic rhinitis: Secondary | ICD-10-CM

## 2024-02-16 DIAGNOSIS — J4540 Moderate persistent asthma, uncomplicated: Secondary | ICD-10-CM | POA: Diagnosis not present

## 2024-02-16 DIAGNOSIS — H1013 Acute atopic conjunctivitis, bilateral: Secondary | ICD-10-CM

## 2024-02-16 NOTE — H&P (Signed)
 73 year old male who initially presented to the emergency room in mid July for gross hematuria CT scan demonstrated potential tumor in the bladder. 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 Due to concerns about the left ureter on CT the ureteroscopy was done to the left ureter and cytology demonstrated no abnormalities.   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 no GH. Urine is clear. WIll plan to set up BCG.  01/21/2024: Unable to pass catheter for BCG concern for urethral stricture. Patient did require annual dilation during initial TURBT. 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 PVR 21, feeling pressure at the tip of the penis and spraying, there appear to be scarring in the fossa navicularis.  02/16/34: proceed with cysto dilation     ALLERGIES: None   MEDICATIONS: Albuterol  Sulfate HFA  amLODIPine  Besylate 5 MG Tablet 1 tablet PO Daily  Mometasone  Furoate  Montelukast  Sodium 10 MG Tablet 1 tablet PO Q PM  Multivitamin  oxyBUTYnin  Chloride 5 MG Tablet 1 tablet PO Daily  Pantoprazole  Sodium 40 MG Tablet Delayed Release 1 tablet PO Daily  Rosuvastatin  Calcium  20 MG Tablet 1 tablet PO Daily  Tamsulosin  HCl 0.4 MG Capsule 1 capsule PO Daily     GU PSH: No GU PSH    NON-GU PSH: Anesth, Bladder Tumor Surg - 05/21/2023 Visit Complexity (formerly [facility]) - 12/19/2023, 12/17/2023     GU PMH: Urinary Retention - 01/15/2024, - 12/17/2023 Bladder Cancer overlapping sites - 12/19/2023, - 12/17/2023 History of bladder cancer    NON-GU PMH: Asthma Hypercholesterolemia    FAMILY HISTORY: No Family History    SOCIAL HISTORY: No Social History    REVIEW OF SYSTEMS:    GU Review Male:   Patient denies frequent  urination, hard to postpone urination, burning/ pain with urination, get up at night to urinate, leakage of urine, stream starts and stops, trouble starting your stream, have to strain to urinate , erection problems, and penile pain.  Gastrointestinal (Upper):   Patient denies nausea, vomiting, and indigestion/ heartburn.  Gastrointestinal (Lower):   Patient denies diarrhea and constipation.  Constitutional:   Patient denies fever, night sweats, weight loss, and fatigue.  Skin:   Patient denies itching and skin rash/ lesion.  Eyes:   Patient denies blurred vision and double vision.  Ears/ Nose/ Throat:   Patient denies sore throat and sinus problems.  Hematologic/Lymphatic:   Patient denies swollen glands and easy bruising.  Cardiovascular:   Patient denies leg swelling and chest pains.  Respiratory:   Patient denies cough and shortness of breath.  Endocrine:   Patient denies excessive thirst.  Musculoskeletal:   Patient denies back pain and joint pain.  Neurological:   Patient denies headaches and dizziness.  Psychologic:   Patient denies depression and anxiety.   VITAL SIGNS: None   GU PHYSICAL EXAMINATION:    Penis: Circumcised penis meatus patent there is small narrowing that can be felt on palpation scarring within the glans that is likely fossa macular stricture. No other abnormalities to the penis.   MULTI-SYSTEM PHYSICAL EXAMINATION:       Complexity of Data:  Source Of History:  Patient  Records Review:   Previous Patient Records  Urine Test Review:   Urinalysis  Urodynamics Review:   Review Bladder Scan   PROCEDURES:         PVR Ultrasound - 48201  Scanned Volume: 21 cc         Visit Complexity - G2211          Urinalysis w/Scope - 81001 Dipstick Dipstick Cont'd Micro  Color: Yellow Bilirubin: Neg WBC/hpf: 10 - 20/hpf  Appearance: Slightly Cloudy Ketones: Neg RBC/hpf: NS (Not Seen)  Specific Gravity: 1.025 Blood: Neg Bacteria: Rare (0-9/hpf)  pH: 5.5 Protein: Neg  Cystals: NS (Not Seen)  Glucose: Neg Urobilinogen: 0.2 Casts: Hyaline    Nitrites: Neg Trichomonas: Not Present    Leukocyte Esterase: 2+ Mucous: Not Present      Epithelial Cells: 10 - 20/hpf      Yeast: NS (Not Seen)      Sperm: Not Present    Notes:      ASSESSMENT:      ICD-10 Details  1 GU:   Bladder Cancer overlapping sites - C67.8 Chronic, Stable  2   Urinary Retention - R33.8 Chronic, Stable  3   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 Offered dilation to do BCG earlier versus transurethral ventral inlay. Patient will need transurethral ventral inlay.   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 We also offered transurethral ventral inlay. Patient stated that he would like a long-term monitor and understanding that this could delay BCG instillation. Will plans for transurethral ventral enema next available.   We discussed risk benefits alternatives procedure including bleeding infection damage chronic structures possible injury to the mouth possible bleeding in the mouth possible graft failure possible stricture recurrence possible need for Optilume dilation. Patient voiced understanding consent was obtained.   Proceed with Cysto dilation today  UA negative pre op

## 2024-02-16 NOTE — Progress Notes (Addendum)
 Addendum:  tried calling again unable to reach since no return call from patient to confirm he received previous message.  Left message again to arrive in am at 0715 npo after midnight w. Clear liquids until 0615.  If any questions or issues in am call pre-op nurse at [phone].  Patient has new start of his surgery scheduled on 09-30-205 at 0915.  Called and left message told patient of new start time of surgery at to arrive at 0715 npo after midnight with exception clear liquids from midnight night before up until 0615.  Asked for patient to call back to confirm he received this message.

## 2024-02-16 NOTE — Anesthesia Preprocedure Evaluation (Signed)
 Anesthesia Evaluation  Patient identified by MRN, date of birth, ID band Patient awake    Reviewed: Allergy  & Precautions, NPO status , Patient's Chart, lab work & pertinent test results  Airway Mallampati: II  TM Distance: >3 FB Neck ROM: Full    Dental no notable dental hx. (+) Teeth Intact, Dental Advisory Given   Pulmonary asthma , COPD,  COPD inhaler, former smoker   Pulmonary exam normal breath sounds clear to auscultation       Cardiovascular hypertension, (-) angina (-) CAD and (-) Past MI Normal cardiovascular exam Rhythm:Regular Rate:Normal     Neuro/Psych negative neurological ROS  negative psych ROS   GI/Hepatic ,GERD  Controlled,,  Endo/Other    Renal/GU      Musculoskeletal negative musculoskeletal ROS (+)    Abdominal   Peds  Hematology Lab Results      Component                Value               Date                      WBC                      11.6 (H)            12/05/2023                HGB                      12.6 (L)            12/05/2023                HCT                      38.1 (L)            12/05/2023                MCV                      97.4                12/05/2023                PLT                      287                 12/05/2023              Anesthesia Other Findings   Reproductive/Obstetrics negative OB ROS                              Anesthesia Physical Anesthesia Plan  ASA: 2  Anesthesia Plan: General   Post-op Pain Management: Precedex  and Ofirmev  IV (intra-op)*   Induction: Intravenous  PONV Risk Score and Plan: 3 and Treatment may vary due to age or medical condition and Ondansetron   Airway Management Planned: LMA  Additional Equipment: None  Intra-op Plan:   Post-operative Plan:   Informed Consent: I have reviewed the patients History and Physical, chart, labs and discussed the procedure including the risks, benefits and  alternatives for the proposed anesthesia with the patient or authorized representative who has indicated his/her  understanding and acceptance.     Dental advisory given  Plan Discussed with: CRNA and Surgeon  Anesthesia Plan Comments:          Anesthesia Quick Evaluation

## 2024-02-16 NOTE — Progress Notes (Signed)
 FOLLOW UP Date of Service/Encounter:   02/16/2024  Subjective:  Roberto [name] (DOB: [birth year]) is a 73 y.o. male who [facility] in re-evaluation of the following: allergic rhinitis and asthma History obtained from: chart review and patient.  For Review, LV was on 12/15/23  with Dr.Jermery Caratachea seen for allergy  testing. See below for summary of history and diagnostics.  ----------------------------------------------------- Pertinent History/Diagnostics:  Other allergic rhinitis Seasonal allergic rhinitis due to pollen Allergic conjunctivitis of both eyes Symptoms of nasal congestion and itchy eyes during spring months (April-June). Managed with Flonase and occasional eye drops. No history of allergy  shots or ENT consultation. 2024 bloodwork was positive to grass, trees, ragweed. Current plan: singulair  10 mg nightly, flonase 1-2 sprays daily, saline spray, consider AIT. Skin testing 12/15/23: positive to grass pollen, ragweed pollen; intradermals positive to weed mix, cat and borderline to tree mix  Discussed considering AIT if breatihng controlled (managed by pulmonary at [facility]) Not well controlled moderate persistent asthma Long-standing history of wheezing and trouble breathing, with increased albuterol  inhaler use in the past year. Recent episodes of nocturnal dyspnea requiring emergency inhaler use. Currently managed with Asmanex  220mcg 1 puff twice daily and Montelukast  daily, with Albuterol  as needed. Recent pulmonary consultation with breathing tests and upcoming CT scan. Had elevated FenO. Managed by Pulm at [facility] Current meds: Asmanex  220 mcg 2 puff BID, combivent respimat Adverse effect of drug, subsequent encounter Reported adverse reactions to testosterone supplement (wheezing). Tolerates other capsule medications with no issues --------------------------------------------------- Today presents for follow-up. Discussed the use of AI scribe  software for clinical note transcription with the patient, who gave verbal consent to proceed.  History of Present Illness Roberto [name] is a 73 year old male who presents for follow-up regarding allergy  management and upcoming procedures.  Allergic rhinitis and ocular symptoms - Allergies are primarily bothersome in the spring, causing sneezing and red eyes. - Uses Flonase, one spray in each nostril daily, and Montelukast  nightly for allergy  management. - Allergy  medications (antihistamines) can cause a sensation of mental fogginess.  Lower respiratory symptoms - Experiences occasional wheezing. - Uses inhaler sparingly; a three-month supply lasts approximately one year. - Engages in four-mile power walks in [place], which helps alleviate wheezing and maintain health. - Follow-up appointment with pulmonology scheduled for November 3rd.  Urologic symptoms and bladder cancer management - History of bladder cancer with prior surgical removal of tumor. - BCG treatments were scheduled to start four weeks ago but were delayed due to inability to insert catheter into urethra. - Procedure scheduled for tomorrow to clear urethral blockage.  Chart Review: Planning for cytoscopy with urethral dilation tomorrow  All medications reviewed by clinical staff and updated in chart. No new pertinent medical or surgical history except as noted in HPI.  ROS: All others negative except as noted per HPI.   Objective:  BP 138/76 (BP Location: Left Arm, Patient Position: Sitting, Cuff Size: Normal)   Pulse 72   Temp 97.9 F (36.6 C) (Temporal)   SpO2 97%  There is no height or weight on file to calculate BMI. Physical Exam: General Appearance:  Alert, cooperative, no distress, appears stated age  Head:  Normocephalic, without obvious abnormality, atraumatic  Eyes:  Conjunctiva clear, EOM's intact  Ears EACs normal bilaterally and normal TMs bilaterally  Nose: Nares normal, normal  mucosa and no visible anterior polyps  Throat: Lips, tongue normal; teeth and gums normal, normal posterior oropharynx  Neck: Supple, symmetrical  Lungs:   clear to auscultation bilaterally, Respirations unlabored, no coughing  Heart:  regular rate and rhythm and no murmur, Appears well perfused  Extremities: No edema  Skin: Skin color, texture, turgor normal and no rashes or lesions on visualized portions of skin  Neurologic: No gross deficits   Labs:  Lab Orders  No laboratory test(s) ordered today    Spirometry:  Tracings reviewed. His effort: Good reproducible efforts. FVC: 3.59L FEV1: 2.04L, 63% predicted FEV1/FVC ratio: 0.57 Interpretation: Spirometry consistent with mild obstructive disease.  Please see scanned spirometry results for details.   Assessment/Plan   Environmental allergies 2024 bloodwork was positive to grass, trees, ragweed. Environmental control measures as below. Continue Singulair  (montelukast ) 10mg  daily at night. Use Flonase (fluticasone) nasal spray 1-2 sprays per nostril once a day as needed for nasal congestion.  Nasal saline spray (i.e., Simply Saline) or nasal saline lavage (i.e., NeilMed) is recommended as needed and prior to medicated nasal sprays. Skin testing 12/15/23: positive to grass pollen, ragweed pollen; intradermals positive to weed mix, cat and borderline to tree mix; allergen avoidance. Hold on allergy  injections due to ongoing health problems  Breathing Keep follow up with pulmonology. Use medications as per pulmonary recommendations. May use albuterol  rescue inhaler 2 puffs every 4 to 6 hours as needed for shortness of breath, chest tightness, coughing, and wheezing. May use albuterol  rescue inhaler 2 puffs 5 to 15 minutes prior to strenuous physical activities. Monitor frequency of use - if you need to use it more than twice per week on a consistent basis let us  know.  Breathing control goals:  Full participation in all desired  activities (may need albuterol  before activity) Albuterol  use two times or less a week on average (not counting use with activity) Cough interfering with sleep two times or less a month Oral steroids no more than once a year No hospitalizations   Follow up : 6 months, sooner if needed. I Hold on allergy  injections due to current health problems.  It was a pleasure seeing you again in clinic today! Thank you for allowing me to participate in your care.  Other: none  Rocky Endow, MD  [facility]  

## 2024-02-16 NOTE — Patient Instructions (Addendum)
 Environmental allergies 2024 bloodwork was positive to grass, trees, ragweed. Environmental control measures as below. Continue Singulair  (montelukast ) 10mg  daily at night. Use Flonase (fluticasone) nasal spray 1-2 sprays per nostril once a day as needed for nasal congestion.  Nasal saline spray (i.e., Simply Saline) or nasal saline lavage (i.e., NeilMed) is recommended as needed and prior to medicated nasal sprays. Skin testing 12/15/23: positive to grass pollen, ragweed pollen; intradermals positive to weed mix, cat and borderline to tree mix; allergen avoidance. Hold on allergy  injections due to ongoing health problems  Breathing Keep follow up with pulmonology. Use medications as per pulmonary recommendations. May use albuterol  rescue inhaler 2 puffs every 4 to 6 hours as needed for shortness of breath, chest tightness, coughing, and wheezing. May use albuterol  rescue inhaler 2 puffs 5 to 15 minutes prior to strenuous physical activities. Monitor frequency of use - if you need to use it more than twice per week on a consistent basis let us  know.  Breathing control goals:  Full participation in all desired activities (may need albuterol  before activity) Albuterol  use two times or less a week on average (not counting use with activity) Cough interfering with sleep two times or less a month Oral steroids no more than once a year No hospitalizations   Follow up : 6 months, sooner if needed. I Hold on allergy  injections due to current health problems.  It was a pleasure seeing you again in clinic today! Thank you for allowing me to participate in your care.  Rocky Endow, MD [facility] [place]   Reducing Pollen Exposure Pollen seasons: trees (spring), grass (summer) and ragweed/weeds (fall). Keep windows closed in your home and car to lower pollen exposure.  Install air conditioning in the bedroom and throughout the house if possible.  Avoid going out in dry windy days -  especially early morning. Pollen counts are highest between 5 - 10 AM and on dry, hot and windy days.  Save outside activities for late afternoon or after a heavy rain, when pollen levels are lower.  Avoid mowing of grass if you have grass pollen allergy . Be aware that pollen can also be transported indoors on people and pets.  Dry your clothes in an automatic dryer rather than hanging them outside where they might collect pollen.  Rinse hair and eyes before bedtime.

## 2024-02-17 ENCOUNTER — Encounter (HOSPITAL_COMMUNITY)
Admission: RE | Disposition: A | Discharge status: Home / Self Care | Payer: Self-pay | Source: Home / Self Care | Attending: Urology

## 2024-02-17 ENCOUNTER — Ambulatory Visit (HOSPITAL_BASED_OUTPATIENT_CLINIC_OR_DEPARTMENT_OTHER): Admitting: Anesthesiology

## 2024-02-17 ENCOUNTER — Other Ambulatory Visit: Payer: Self-pay

## 2024-02-17 ENCOUNTER — Encounter (HOSPITAL_COMMUNITY): Payer: Self-pay | Admitting: Urology

## 2024-02-17 ENCOUNTER — Ambulatory Visit (HOSPITAL_COMMUNITY): Admitting: Anesthesiology

## 2024-02-17 ENCOUNTER — Ambulatory Visit (HOSPITAL_COMMUNITY)
Admission: RE | Admit: 2024-02-17 | Discharge: 2024-02-17 | Disposition: A | Discharge status: Home / Self Care | Attending: Urology | Admitting: Urology

## 2024-02-17 DIAGNOSIS — N35819 Other urethral stricture, male, unspecified site: Secondary | ICD-10-CM

## 2024-02-17 DIAGNOSIS — Z01818 Encounter for other preprocedural examination: Secondary | ICD-10-CM

## 2024-02-17 DIAGNOSIS — N401 Enlarged prostate with lower urinary tract symptoms: Secondary | ICD-10-CM | POA: Insufficient documentation

## 2024-02-17 DIAGNOSIS — C678 Malignant neoplasm of overlapping sites of bladder: Secondary | ICD-10-CM | POA: Insufficient documentation

## 2024-02-17 DIAGNOSIS — I10 Essential (primary) hypertension: Secondary | ICD-10-CM | POA: Insufficient documentation

## 2024-02-17 DIAGNOSIS — K219 Gastro-esophageal reflux disease without esophagitis: Secondary | ICD-10-CM | POA: Diagnosis not present

## 2024-02-17 DIAGNOSIS — N99115 Postprocedural fossa navicularis urethral stricture: Secondary | ICD-10-CM | POA: Insufficient documentation

## 2024-02-17 DIAGNOSIS — J4489 Other specified chronic obstructive pulmonary disease: Secondary | ICD-10-CM | POA: Diagnosis not present

## 2024-02-17 DIAGNOSIS — R338 Other retention of urine: Secondary | ICD-10-CM | POA: Insufficient documentation

## 2024-02-17 HISTORY — DX: Gastro-esophageal reflux disease without esophagitis: K21.9

## 2024-02-17 HISTORY — DX: Essential (primary) hypertension: I10

## 2024-02-17 HISTORY — PX: CYSTOSCOPY WITH URETHRAL DILATATION: SHX5125

## 2024-02-17 LAB — BASIC METABOLIC PANEL WITH GFR
Anion gap: 11 (ref 5–15)
BUN: 14 mg/dL (ref 8–23)
CO2: 23 mmol/L (ref 22–32)
Calcium: 9.2 mg/dL (ref 8.9–10.3)
Chloride: 103 mmol/L (ref 98–111)
Creatinine, Ser: 0.8 mg/dL (ref 0.61–1.24)
GFR, Estimated: >60 mL/min (ref >60)
Glucose, Bld: 101 mg/dL — ABNORMAL HIGH (ref 70–99)
Potassium: 4 mmol/L (ref 3.5–5.1)
Sodium: 137 mmol/L (ref 135–145)

## 2024-02-17 SURGERY — CYSTOSCOPY, WITH URETHRAL DILATION
Anesthesia: General

## 2024-02-17 MED ORDER — ACETAMINOPHEN 10 MG/ML IV SOLN
INTRAVENOUS | Status: AC
Start: 2024-02-17 — End: 2024-02-17
  Filled 2024-02-17: qty 100

## 2024-02-17 MED ORDER — OXYCODONE HCL 5 MG PO TABS: 5.0000 mg | ORAL_TABLET | Freq: Four times a day (QID) | ORAL | 0 refills | Status: AC | PRN

## 2024-02-17 MED ORDER — DEXTROSE 5 % IV SOLN
5.0000 mg/kg | INTRAVENOUS | Status: AC
  Administered 2024-02-17: 480 mg via INTRAVENOUS
  Filled 2024-02-17: qty 12

## 2024-02-17 MED ORDER — FENTANYL CITRATE (PF) 250 MCG/5ML IJ SOLN
INTRAMUSCULAR | Status: DC | PRN
  Administered 2024-02-17: 50 ug via INTRAVENOUS

## 2024-02-17 MED ORDER — ACETAMINOPHEN 10 MG/ML IV SOLN: 1000.0000 mg | Freq: Once | INTRAVENOUS | Status: DC | PRN

## 2024-02-17 MED ORDER — EPHEDRINE 5 MG/ML INJ
INTRAVENOUS | Status: AC
  Filled 2024-02-17: qty 5

## 2024-02-17 MED ORDER — ONDANSETRON HCL 4 MG/2ML IJ SOLN
INTRAMUSCULAR | Status: DC | PRN
Start: 2024-02-17 — End: 2024-02-17
  Administered 2024-02-17: 4 mg via INTRAVENOUS

## 2024-02-17 MED ORDER — MIDAZOLAM HCL 2 MG/2ML IJ SOLN
INTRAMUSCULAR | Status: AC
  Filled 2024-02-17: qty 2

## 2024-02-17 MED ORDER — ONDANSETRON HCL 4 MG/2ML IJ SOLN
INTRAMUSCULAR | Status: AC
Start: 2024-02-17 — End: 2024-02-17
  Filled 2024-02-17: qty 2

## 2024-02-17 MED ORDER — LIDOCAINE 2% (20 MG/ML) 5 ML SYRINGE
INTRAMUSCULAR | Status: AC
  Filled 2024-02-17: qty 5

## 2024-02-17 MED ORDER — SODIUM CHLORIDE 0.9 % IV SOLN
1.0000 g | INTRAVENOUS | Status: AC
  Administered 2024-02-17: 1 g via INTRAVENOUS
  Filled 2024-02-17: qty 1000

## 2024-02-17 MED ORDER — DEXAMETHASONE SODIUM PHOSPHATE 10 MG/ML IJ SOLN
INTRAMUSCULAR | Status: AC
  Filled 2024-02-17: qty 1

## 2024-02-17 MED ORDER — FENTANYL CITRATE (PF) 250 MCG/5ML IJ SOLN
INTRAMUSCULAR | Status: AC
  Filled 2024-02-17: qty 5

## 2024-02-17 MED ORDER — CHLORHEXIDINE GLUCONATE 0.12 % MT SOLN
OROMUCOSAL | Status: AC
  Administered 2024-02-17: 15 mL via OROMUCOSAL
  Filled 2024-02-17: qty 15

## 2024-02-17 MED ORDER — MIDAZOLAM HCL 2 MG/2ML IJ SOLN
INTRAMUSCULAR | Status: DC | PRN
  Administered 2024-02-17 (×2): 1 mg via INTRAVENOUS

## 2024-02-17 MED ORDER — ONDANSETRON HCL 4 MG/2ML IJ SOLN: 4.0000 mg | Freq: Once | INTRAMUSCULAR | Status: DC | PRN

## 2024-02-17 MED ORDER — LACTATED RINGERS IV SOLN
INTRAVENOUS | Status: DC
Start: 2024-02-17 — End: 2024-02-17

## 2024-02-17 MED ORDER — LIDOCAINE 2% (20 MG/ML) 5 ML SYRINGE
INTRAMUSCULAR | Status: DC | PRN
  Administered 2024-02-17: 50 mg via INTRAVENOUS

## 2024-02-17 MED ORDER — FENTANYL CITRATE (PF) 100 MCG/2ML IJ SOLN: 25.0000 ug | INTRAMUSCULAR | Status: DC | PRN

## 2024-02-17 MED ORDER — EPHEDRINE SULFATE-NACL 50-0.9 MG/10ML-% IV SOSY
PREFILLED_SYRINGE | INTRAVENOUS | Status: DC | PRN
  Administered 2024-02-17: 10 mg via INTRAVENOUS

## 2024-02-17 MED ORDER — PROPOFOL 10 MG/ML IV BOLUS
INTRAVENOUS | Status: DC | PRN
  Administered 2024-02-17: 30 mg via INTRAVENOUS
  Administered 2024-02-17: 150 mg via INTRAVENOUS

## 2024-02-17 MED ORDER — ACETAMINOPHEN 10 MG/ML IV SOLN
INTRAVENOUS | Status: DC | PRN
  Administered 2024-02-17: 1000 mg via INTRAVENOUS

## 2024-02-17 MED ORDER — STERILE WATER FOR IRRIGATION IR SOLN
Status: DC | PRN
  Administered 2024-02-17: 3000 mL via INTRAVESICAL

## 2024-02-17 MED ORDER — CHLORHEXIDINE GLUCONATE 0.12 % MT SOLN: 15.0000 mL | Freq: Once | OROMUCOSAL | Status: AC

## 2024-02-17 MED ORDER — POLYETHYLENE GLYCOL 3350 17 G PO PACK: 17.0000 g | PACK | Freq: Every day | ORAL | 0 refills | Status: AC | PRN

## 2024-02-17 MED ORDER — PHENYLEPHRINE HCL-NACL 20-0.9 MG/250ML-% IV SOLN
INTRAVENOUS | Status: DC | PRN
  Administered 2024-02-17: 50 ug/min via INTRAVENOUS

## 2024-02-17 MED ORDER — LIDOCAINE HCL URETHRAL/MUCOSAL 2 % EX GEL
CUTANEOUS | Status: AC
  Filled 2024-02-17: qty 11

## 2024-02-17 MED ORDER — PROPOFOL 10 MG/ML IV BOLUS
INTRAVENOUS | Status: AC
  Filled 2024-02-17: qty 20

## 2024-02-17 MED ORDER — DEXAMETHASONE SODIUM PHOSPHATE 10 MG/ML IJ SOLN
INTRAMUSCULAR | Status: DC | PRN
  Administered 2024-02-17: 10 mg via INTRAVENOUS

## 2024-02-17 MED ORDER — SODIUM CHLORIDE 0.9 % IR SOLN: Status: DC | PRN

## 2024-02-17 MED ORDER — ORAL CARE MOUTH RINSE: 15.0000 mL | Freq: Once | OROMUCOSAL | Status: AC

## 2024-02-17 SURGICAL SUPPLY — 12 items
BAG URINE DRAIN 2000ML AR STRL (UROLOGICAL SUPPLIES) ×1 IMPLANT
CATH FOLEY 2W COUNCIL 20FR 5CC (CATHETERS) IMPLANT
CATH URETL OPEN END 6FR 70 (CATHETERS) IMPLANT
GLOVE BIO SURGEON STRL SZ8 (GLOVE) ×1 IMPLANT
GOWN STRL SURGICAL XL XLNG (GOWN DISPOSABLE) ×1 IMPLANT
GUIDEWIRE STR DUAL SENSOR (WIRE) ×1 IMPLANT
HOLDER FOLEY CATH W/STRAP (MISCELLANEOUS) IMPLANT
KIT TURNOVER KIT B (KITS) IMPLANT
MANIFOLD NEPTUNE II (INSTRUMENTS) IMPLANT
PACK CYSTO (CUSTOM PROCEDURE TRAY) ×1 IMPLANT
SOLN STERILE WATER 3000 ML (IV SOLUTION) IMPLANT
WATER STERILE IRR 3000ML UROMA (IV SOLUTION) ×1 IMPLANT

## 2024-02-17 NOTE — Discharge Instructions (Addendum)
 Discharge Urethral Dilation   Definition:  Stretching the urethra with the goal of allowing larger caliber urethra to improve urination.   General instructions:    Because the raw surface inside your bladder and the irritating effects of urine you may expect frequency of urination and/or urgency (a stronger desire to urinate) and perhaps even getting up at night more often. This will usually resolve or improve slowly over the healing period. You may see some blood in your urine over the first 6 weeks. Do not be alarmed, even if the urine was clear for a while. Get off your feet and drink lots of fluids until clearing occurs. If you start to pass clots or don't improve call us .  Catheter: A catheter is necissary for the urethra to heal. Included  are catheter instructions on how to care for the catheter will be attached to your discharge paperwork. You will be called to schedule an appointment to remove the catheter.   Diet:  You may return to your normal diet immediately. Because of the raw surface of your bladder, alcohol, spicy foods, foods high in acid and drinks with caffeine may cause irritation or frequency and should be used in moderation. To keep your urine flowing freely and avoid constipation, drink plenty of fluids during the day (8-10 glasses). Tip: Avoid cranberry juice because it is very acidic.  Activity:  Do not lift more than 10 LBS for 2 weeks Do not strain with bowel movents.  Your physical activity doesn't need to be restricted. However, if you are very active, you may see some blood in the urine. We suggest that you reduce your activity under the circumstances until the bleeding has stopped.  Bowels:  It is important to keep your bowels regular during the postoperative period. Straining with bowel movements can cause bleeding. A bowel movement every other day is reasonable. Use a mild laxative if needed, such as milk of magnesia 2-3 tablespoons, or 2 Dulcolax tablets.  Call if you continue to have problems. If you had been taking narcotics for pain, before, during or after your surgery, you may be constipated. Take a laxative if necessary.    Medication:  You should resume your pre-surgery medications unless told not to. In addition you may be given an antibiotic to prevent or treat infection. Antibiotics are not always necessary. All medication should be taken as prescribed until the bottles are finished unless you are having an unusual reaction to one of the drugs.   Foley Catheter Care A soft, flexible tube (Foley catheter) may have been placed in your bladder to drain urine and fluid. Follow these instructions: Taking Care of the Catheter Keep the area where the catheter leaves your body clean.  Attach the catheter to the leg so there is no tension on the catheter.  Keep the drainage bag below the level of the bladder, but keep it OFF the floor.  Do not take long soaking baths. Your caregiver will give instructions about showering.  Wash your hands before touching ANYTHING related to the catheter or bag.  Using mild soap and warm water on a washcloth:  Clean the area closest to the catheter insertion site using a circular motion around the catheter.  Clean the catheter itself by wiping AWAY from the insertion site for several inches down the tube.  NEVER wipe upward as this could sweep bacteria up into the urethra (tube in your body that normally drains the bladder) and cause infection.  Place a small  amount of sterile lubricant at the tip of the penis where the catheter is entering.  Taking Care of the Drainage Bags Two drainage bags may be taken home: a large overnight drainage bag, and a smaller leg bag which fits underneath clothing.  It is okay to wear the overnight bag at any time, but NEVER wear the smaller leg bag at night.  Keep the drainage bag well below the level of your bladder. This prevents backflow of urine into the bladder and allows  the urine to drain freely.  Anchor the tubing to your leg to prevent pulling or tension on the catheter. Use tape or a leg strap provided by the hospital.  Empty the drainage bag when it is 1/2 to 3/4 full. Wash your hands before and after touching the bag.  Periodically check the tubing for kinks to make sure there is no pressure on the tubing which could restrict the flow of urine.  Changing the Drainage Bags Cleanse both ends of the clean bag with alcohol before changing.  Pinch off the rubber catheter to avoid urine spillage during the disconnection.  Disconnect the dirty bag and connect the clean one.  Empty the dirty bag carefully to avoid a urine spill.  Attach the new bag to the leg with tape or a leg strap.  Cleaning the Drainage Bags Whenever a drainage bag is disconnected, it must be cleaned quickly so it is ready for the next use.  Wash the bag in warm, soapy water.  Rinse the bag thoroughly with warm water.  Soak the bag for 30 minutes in a solution of white vinegar and water (1 cup vinegar to 1 quart warm water).  Rinse with warm water.   IT Normal To See Some blood in the urine is normal, as long as you can see your fingers through the catheter tubing (not the bag) this is ok. As long as the urine is not the consistency of tomato paste.  It will be normal to feel the urge to urinate often this is a side effect of the catheter  Some discharge around the catheter where it exits the penis is normal  SEEK MEDICAL CARE IF:  You have chills or night sweats.  You are leaking around your catheter or have problems with your catheter. It is not uncommon to have sporadic leakage around your catheter as a result of bladder spasms. If the leakage stops, there is not much need for concern. If you are uncertain, call your caregiver.  You develop side effects that you think are coming from your medicines.  SEEK IMMEDIATE MEDICAL CARE IF:  You are suddenly unable to urinate. Check to see if  there are any kinks in the drainage tubing that may cause this. If you cannot find any kinks, call your caregiver immediately. This is an emergency.  You develop shortness of breath or chest pains.  Bleeding persists or clots develop in your urine.  You have a fever.  You develop pain in your back or over your lower belly (abdomen).  You develop pain or swelling in your legs.  Any problems you are having get worse rather than better.  MAKE SURE YOU:  Understand these instructions.  Will watch your condition.  Will get help right away if you are not doing well or get worse.       Post Anesthesia Home Care Instructions  Activity: Get plenty of rest for the remainder of the day. A responsible adult should stay with  you for 24 hours following the procedure.  For the next 24 hours, DO NOT: -Drive a car -Advertising copywriter -Drink alcoholic beverages -Take any medication unless instructed by your physician -Make any legal decisions or sign important papers.  Meals: Start with liquid foods such as gelatin or soup. Progress to regular foods as tolerated. Avoid greasy, spicy, heavy foods. If nausea and/or vomiting occur, drink only clear liquids until the nausea and/or vomiting subsides. Call your physician if vomiting continues.  Special Instructions/Symptoms: Your throat may feel dry or sore from the anesthesia or the breathing tube placed in your throat during surgery. If this causes discomfort, gargle with warm salt water. The discomfort should disappear within 24 hours.

## 2024-02-17 NOTE — Op Note (Signed)
 Operative note urethral dilation  Preoperative diagnosis: Fossa navicularis stricture  Postop diagnosis: Same  Operative findings: Fossa navicularis stricture, no other strictures noted within the urethra.  Large obstructing prostate. No evidence of recurrence within the bladder on cystoscopy.  Surgeon: Steffan Pea  Anesthesia: General  Antibiotics: Ampicillin gentamicin  Fluids: Per anesthesia  EBL: Minimal  Drains: 20 French council catheter 10 cc balloon  Specimens: None  Complications: None  Indication for procedure: Mr. 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  If this was necessary doing a permanent repair using graft would likely be destroyed because of the caliber of the resectoscope for this we decided to do urethral dilation with eventual permanent repair using graft.  This will be necessary for BCG installations as well as potential repeat TURBT.  We discussed risk benefits alternatives the procedure and patient is agreeable to proceed.  Procedure in detail: After informed sent was confirmed in the preoperative area.  Patient was taken the operative suite placed under anesthesia by the anesthesia team then placed in the dorsal lithotomy position.  Preoperative antibiotics were given and timeout was performed verifying correct patient and procedure.  First a sensor was then placed down the urethra with significant  length to adequately be inserted in the bladder.  Next [place] sounds were used to sequentially dilate from [place] to 24 [place].  Once this was done a 21 French cystoscope was advanced through the urethra and into the bladder of note there were no other urethral strictures noted the prostate was large and obstructing.  Pan cystoscopy demonstrated no tumors healing scar in the left lateral bladder wall where the previous resection was but no evidence of cancer recurrence.  The wire was then replaced through the scope into the bladder.  The scope was then removed and a 20 [place] council catheter was placed over the wire into the bladder 10 cc inflated balloon.  Patient was extubated in the operative suite and taken the PACU in stable condition.  Disposition: Patient will need to follow-up on Friday for catheter removal and will be taught to do CIC to keep fossa navicularis open during his BCG treatments.

## 2024-02-17 NOTE — Anesthesia Procedure Notes (Signed)
 Procedure Name: LMA Insertion Date/Time: 02/17/2024 9:18 AM  Performed by: Jolynn Mage, CRNAPre-anesthesia Checklist: Patient identified, Emergency Drugs available, Suction available and Patient being monitored Patient Re-evaluated:Patient Re-evaluated prior to induction Oxygen Delivery Method: Circle system utilized Preoxygenation: Pre-oxygenation with 100% oxygen Induction Type: IV induction Ventilation: Mask ventilation without difficulty LMA: LMA flexible inserted LMA Size: 4.0 Number of attempts: 1 Placement Confirmation: positive ETCO2 and breath sounds checked- equal and bilateral Tube secured with: Tape Dental Injury: Teeth and Oropharynx as per pre-operative assessment

## 2024-02-17 NOTE — Anesthesia Postprocedure Evaluation (Signed)
 Anesthesia Post Note  Patient: Roberto [name]  Procedure(s) Performed: CYSTOSCOPY, WITH URETHRAL DILATION     Patient location during evaluation: PACU Anesthesia Type: General Level of consciousness: awake and alert Pain management: pain level controlled Vital Signs Assessment: post-procedure vital signs reviewed and stable Respiratory status: spontaneous breathing, nonlabored ventilation, respiratory function stable and patient connected to nasal cannula oxygen Cardiovascular status: blood pressure returned to baseline and stable Postop Assessment: no apparent nausea or vomiting Anesthetic complications: no   No notable events documented.  Last Vitals:  Vitals:   02/17/24 1015 02/17/24 1031  BP: 127/73 (!) 141/77  Pulse: 76 65  Resp: 17 17  Temp:  36.7 C  SpO2: 96% 98%    Last Pain:  Vitals:   02/17/24 1015  TempSrc:   PainSc: 0-No pain                 Garnette DELENA Gab

## 2024-02-17 NOTE — Transfer of Care (Signed)
 Immediate Anesthesia Transfer of Care Note  Patient: Roberto [name]  Procedure(s) Performed: CYSTOSCOPY, WITH URETHRAL DILATION  Patient Location: PACU  Anesthesia Type:General  Level of Consciousness: awake, alert , patient cooperative, and responds to stimulation  Airway & Oxygen Therapy: Patient Spontanous Breathing and Patient connected to face mask oxygen  Post-op Assessment: Report given to RN and Post -op Vital signs reviewed and stable  Post vital signs: Reviewed and stable  Last Vitals:  Vitals Value Taken Time  BP 130/61 02/17/24 09:48  Temp 36.5 C 02/17/24 09:48  Pulse 80 02/17/24 09:51  Resp 14 02/17/24 09:51  SpO2 96 % 02/17/24 09:51  Vitals shown include unfiled device data.  Last Pain:  Vitals:   02/17/24 0815  TempSrc: Oral  PainSc: 0-No pain      Patients Stated Pain Goal: 3 (02/17/24 0815)  Complications: No notable events documented.

## 2024-02-17 NOTE — Discharge Instr - Supplementary Instructions (Signed)
 Tylenol  given at 9:42am

## 2024-02-18 ENCOUNTER — Encounter (HOSPITAL_COMMUNITY): Payer: Self-pay | Admitting: Urology

## 2024-08-16 ENCOUNTER — Ambulatory Visit: Admitting: Internal Medicine
